# Patient Record
Sex: Female | Born: 1985
Health system: Southern US, Community
[De-identification: ages and names within clinical notes are randomized; demographics above are authoritative.]

## PROBLEM LIST (undated history)

## (undated) DIAGNOSIS — Z862 Personal history of diseases of the blood and blood-forming organs and certain disorders involving the immune mechanism: Secondary | ICD-10-CM

## (undated) DIAGNOSIS — G43909 Migraine, unspecified, not intractable, without status migrainosus: Secondary | ICD-10-CM

## (undated) DIAGNOSIS — Z973 Presence of spectacles and contact lenses: Secondary | ICD-10-CM

## (undated) DIAGNOSIS — M1990 Unspecified osteoarthritis, unspecified site: Secondary | ICD-10-CM

## (undated) DIAGNOSIS — D649 Anemia, unspecified: Secondary | ICD-10-CM

## (undated) DIAGNOSIS — N8003 Adenomyosis of the uterus: Secondary | ICD-10-CM

## (undated) DIAGNOSIS — F909 Attention-deficit hyperactivity disorder, unspecified type: Secondary | ICD-10-CM

## (undated) DIAGNOSIS — M069 Rheumatoid arthritis, unspecified: Secondary | ICD-10-CM

## (undated) HISTORY — PX: OVUM / OOCYTE RETRIEVAL: SUR1269

## (undated) HISTORY — PX: HYSTEROSCOPY: SHX211

## (undated) HISTORY — DX: Attention-deficit hyperactivity disorder, unspecified type: F90.9

## (undated) HISTORY — DX: Migraine, unspecified, not intractable, without status migrainosus: G43.909

## (undated) HISTORY — PX: NO PAST SURGERIES: SHX2092

---

## 2016-11-29 HISTORY — PX: DILATION AND CURETTAGE OF UTERUS: SHX78

## 2017-02-16 HISTORY — PX: DILATION AND CURETTAGE OF UTERUS: SHX78

## 2019-06-27 LAB — OB RESULTS CONSOLE RUBELLA ANTIBODY, IGM: Rubella: IMMUNE

## 2019-06-27 LAB — OB RESULTS CONSOLE HEPATITIS B SURFACE ANTIGEN: Hepatitis B Surface Ag: NEGATIVE

## 2019-09-19 ENCOUNTER — Other Ambulatory Visit: Payer: Self-pay

## 2019-09-19 DIAGNOSIS — Z20822 Contact with and (suspected) exposure to covid-19: Secondary | ICD-10-CM

## 2019-09-20 LAB — NOVEL CORONAVIRUS, NAA: SARS-CoV-2, NAA: NOT DETECTED

## 2019-11-28 DIAGNOSIS — Z3A32 32 weeks gestation of pregnancy: Secondary | ICD-10-CM | POA: Diagnosis not present

## 2019-11-28 DIAGNOSIS — O3663X0 Maternal care for excessive fetal growth, third trimester, not applicable or unspecified: Secondary | ICD-10-CM | POA: Diagnosis not present

## 2019-12-13 DIAGNOSIS — Z23 Encounter for immunization: Secondary | ICD-10-CM | POA: Diagnosis not present

## 2019-12-26 DIAGNOSIS — Z3A36 36 weeks gestation of pregnancy: Secondary | ICD-10-CM | POA: Diagnosis not present

## 2019-12-26 DIAGNOSIS — O368399 Maternal care for abnormalities of the fetal heart rate or rhythm, unspecified trimester, other fetus: Secondary | ICD-10-CM | POA: Diagnosis not present

## 2019-12-26 DIAGNOSIS — Z3685 Encounter for antenatal screening for Streptococcus B: Secondary | ICD-10-CM | POA: Diagnosis not present

## 2019-12-28 ENCOUNTER — Inpatient Hospital Stay (HOSPITAL_COMMUNITY)
Admission: AD | Admit: 2019-12-28 | Discharge: 2019-12-29 | Disposition: A | Discharge status: Home / Self Care | Payer: BC Managed Care – PPO | Attending: Obstetrics and Gynecology | Admitting: Obstetrics and Gynecology

## 2019-12-28 ENCOUNTER — Other Ambulatory Visit: Payer: Self-pay

## 2019-12-28 ENCOUNTER — Encounter (HOSPITAL_COMMUNITY): Payer: Self-pay | Admitting: Obstetrics and Gynecology

## 2019-12-28 DIAGNOSIS — O26893 Other specified pregnancy related conditions, third trimester: Secondary | ICD-10-CM | POA: Diagnosis not present

## 2019-12-28 DIAGNOSIS — W19XXXA Unspecified fall, initial encounter: Secondary | ICD-10-CM

## 2019-12-28 DIAGNOSIS — Z79899 Other long term (current) drug therapy: Secondary | ICD-10-CM | POA: Diagnosis not present

## 2019-12-28 DIAGNOSIS — O9A213 Injury, poisoning and certain other consequences of external causes complicating pregnancy, third trimester: Secondary | ICD-10-CM | POA: Diagnosis present

## 2019-12-28 DIAGNOSIS — W1839XA Other fall on same level, initial encounter: Secondary | ICD-10-CM | POA: Insufficient documentation

## 2019-12-28 DIAGNOSIS — Z7982 Long term (current) use of aspirin: Secondary | ICD-10-CM | POA: Insufficient documentation

## 2019-12-28 DIAGNOSIS — Z20822 Contact with and (suspected) exposure to covid-19: Secondary | ICD-10-CM | POA: Diagnosis not present

## 2019-12-28 DIAGNOSIS — S80211A Abrasion, right knee, initial encounter: Secondary | ICD-10-CM | POA: Insufficient documentation

## 2019-12-28 DIAGNOSIS — Y92481 Parking lot as the place of occurrence of the external cause: Secondary | ICD-10-CM | POA: Insufficient documentation

## 2019-12-28 DIAGNOSIS — O4700 False labor before 37 completed weeks of gestation, unspecified trimester: Secondary | ICD-10-CM | POA: Diagnosis not present

## 2019-12-28 DIAGNOSIS — Z3A36 36 weeks gestation of pregnancy: Secondary | ICD-10-CM | POA: Diagnosis not present

## 2019-12-28 DIAGNOSIS — S3991XA Unspecified injury of abdomen, initial encounter: Secondary | ICD-10-CM | POA: Insufficient documentation

## 2019-12-28 DIAGNOSIS — O4703 False labor before 37 completed weeks of gestation, third trimester: Secondary | ICD-10-CM | POA: Diagnosis not present

## 2019-12-28 HISTORY — DX: Unspecified osteoarthritis, unspecified site: M19.90

## 2019-12-28 LAB — TYPE AND SCREEN
ABO/RH(D): A POS
Antibody Screen: NEGATIVE

## 2019-12-28 LAB — ABO/RH: ABO/RH(D): A POS

## 2019-12-28 LAB — OB RESULTS CONSOLE GBS: GBS: NEGATIVE

## 2019-12-28 LAB — SARS CORONAVIRUS 2 (TAT 6-24 HRS): SARS Coronavirus 2: NEGATIVE

## 2019-12-28 MED ORDER — DOCUSATE SODIUM 100 MG PO CAPS
100.0000 mg | ORAL_CAPSULE | Freq: Every day | ORAL | Status: DC
  Administered 2019-12-29: 09:00:00 100 mg via ORAL
  Filled 2019-12-28: qty 1

## 2019-12-28 MED ORDER — SODIUM CHLORIDE 0.9 % IV SOLN: 250.0000 mL | INTRAVENOUS | Status: DC | PRN

## 2019-12-28 MED ORDER — SODIUM CHLORIDE 0.9% FLUSH
3.0000 mL | Freq: Two times a day (BID) | INTRAVENOUS | Status: DC
  Administered 2019-12-28 – 2019-12-29 (×2): 3 mL via INTRAVENOUS

## 2019-12-28 MED ORDER — ACETAMINOPHEN 325 MG PO TABS: 650.0000 mg | ORAL_TABLET | ORAL | Status: DC | PRN

## 2019-12-28 MED ORDER — ZOLPIDEM TARTRATE 5 MG PO TABS: 5.0000 mg | ORAL_TABLET | Freq: Every evening | ORAL | Status: DC | PRN

## 2019-12-28 MED ORDER — CALCIUM CARBONATE ANTACID 500 MG PO CHEW: 2.0000 | CHEWABLE_TABLET | ORAL | Status: DC | PRN

## 2019-12-28 MED ORDER — SODIUM CHLORIDE 0.9% FLUSH: 3.0000 mL | INTRAVENOUS | Status: DC | PRN

## 2019-12-28 MED ORDER — PRENATAL MULTIVITAMIN CH
1.0000 | ORAL_TABLET | Freq: Every day | ORAL | Status: DC
  Administered 2019-12-29: 09:00:00 1 via ORAL
  Filled 2019-12-28: qty 1

## 2019-12-28 NOTE — H&P (Signed)
Colleen [name] is a 34 y.o. female G3P1011 at 36+ with fall on abdomen and contractions - admitted for observation for 24 hours.  Blood type is A+, Placenta is posterior/fundal.    OB History    Gravida  2   Para  1   Term  1   Preterm      AB      Living  1     SAB      TAB      Ectopic      Multiple      Live Births  1         G1 SAB G2 02/01/17 - SVD female (Jonah) 7#5 - PIH 39wk G3 present  No abn pap, last 4/18 No STD  Past Medical History:  Diagnosis Date  . Arthritis   Rheumatoid arthritis  History reviewed. No pertinent surgical history. Family History: HTN, CAD, Alzheimer's  Social History:  reports that she has never smoked. She has never used smokeless tobacco. She reports that she does not drink alcohol or use drugs. married music and Designer, industrial/product at [employer] cyclobenzaprine, Iron, hydroxychloroquine, ASA All NKDA     Maternal Diabetes: No Genetic Screening: Abnormal:  Results: Elevated risk of Trisomy 21 Maternal Ultrasounds/Referrals: Declined Fetal Ultrasounds or other Referrals:  None Maternal Substance Abuse:  No Significant Maternal Medications:  Meds include: Other: hydrochloroquine Significant Maternal Lab Results:  Group B Strep negative Other Comments:  declined panorama or MFM referral DSR 1:100  Review of Systems  Constitutional: Negative.   HENT: Negative.   Eyes: Negative.   Respiratory: Negative.   Cardiovascular: Negative.   Gastrointestinal: Negative.        GRAVID  Genitourinary: Negative.   Musculoskeletal: Positive for myalgias.  Skin: Negative.   Psychiatric/Behavioral: Negative.    Maternal Medical History:  Contractions: Frequency: irregular.    Fetal activity: Perceived fetal activity is normal.    Prenatal complications: Fell on abdomen; h/o PIH IOL at 39wk  Prenatal Complications - Diabetes: none.      Blood pressure 125/73, pulse 84, temperature 98.1 F (36.7 C), temperature source  Oral, resp. rate 16, height 5\' 2"  (1.575 m), weight 87.1 kg, last menstrual period 04/18/2019, SpO2 100 %. Maternal Exam:  Uterine Assessment: Contraction strength is mild.  Abdomen: Patient reports no abdominal tenderness. Fundal height is appropriate for gestation.   Estimated fetal weight is 6.5#.    Introitus: Normal vulva. Normal vagina.    Physical Exam  Constitutional: She is oriented to person, place, and time. She appears well-developed and well-nourished.  HENT:  Head: Normocephalic and atraumatic.  Cardiovascular: Normal rate and regular rhythm.  Respiratory: Effort normal and breath sounds normal. No respiratory distress. She has no wheezes.  GI: Soft. Bowel sounds are normal.  GRAVID  Genitourinary:    Vulva normal.   Musculoskeletal:        General: Normal range of motion.  Neurological: She is alert and oriented to person, place, and time.  Skin: Skin is warm and dry.  Psychiatric: She has a normal mood and affect. Her behavior is normal.    Prenatal labs: ABO, Rh: --/--/A POS, A POS Performed at [facility], [address] [place], [place]  [phone]) Antibody: NEG (01/29 1608) Rubella: Immune (07/29 0000) RPR:   NR HBsAg: Negative (07/29 0000)  HIV:   NR GBS:   neg  Flu 9/28, Tdap 1/14 Hgb 12.7/Plt 296/Ur Cx neg/GC neg/Chl neg/Varicella immune/Hgb  electro WNL/CF neg/Tay Sach's neg/Sickle neg/SMA neg/ Fragile X neg/glucola 92/Hep C neg  First trimester screen 1:100 DSR, nl NT Declines MFM referral or NIPT  EDC 2/24 - nl early anat, post plac, nl NT Anat [place] - nl anat, post plac, surprise gender    Assessment/Plan: 33yo G3P1011 at 36+ w abdominal trauma  Admit for 24 hr obs - cont EFM and toco Regular diet Monitor ctx  Judah Chevere Bovard-Stuckert 12/28/2019, 5:54 PM

## 2019-12-28 NOTE — MAU Provider Note (Signed)
History     CSN: [id number]  Arrival date and time: 12/28/19 1352   First Provider Initiated Contact with Patient 12/28/19 1520      Chief Complaint  Patient presents with  . Fall   HPI   Ms.Colleen [name] is a 34 y.o. female G2P1001 @ [redacted]w[redacted]d here in MAU with complaints of fall. States she rolled her ankle in the parking lot and fell onto her abdomen. "my abdomen hit the pavement". She says her right knee and right hand landed first and then her abdomen hit the pavement. No bleeding, + fetal movement.   OB History    Gravida  2   Para  1   Term  1   Preterm      AB      Living  1     SAB      TAB      Ectopic      Multiple      Live Births  1           Past Medical History:  Diagnosis Date  . Arthritis     History reviewed. No pertinent surgical history.  History reviewed. No pertinent family history.  Social History   Tobacco Use  . Smoking status: Never Smoker  . Smokeless tobacco: Never Used  Substance Use Topics  . Alcohol use: Never  . Drug use: Never    Allergies: No Known Allergies  Medications Prior to Admission  Medication Sig Dispense Refill Last Dose  . aspirin 81 MG chewable tablet Chew by mouth daily.   12/27/2019 at Unknown time  . hydroxychloroquine (PLAQUENIL) 200 MG tablet Take 300 mg by mouth daily.   12/27/2019 at Unknown time  . Prenatal Vit-Fe Fumarate-FA (PRENATAL MULTIVITAMIN) TABS tablet Take 1 tablet by mouth daily at 12 noon.   12/27/2019 at Unknown time   Results for orders placed or performed during the hospital encounter of 12/28/19 (from the past 48 hour(s))  Type and screen [facility]     Status: None   Collection Time: 12/28/19  4:08 PM  Result Value Ref Range   ABO/RH(D) A POS    Antibody Screen NEG    Sample Expiration      12/31/2019,2359 Performed at [facility], [address] [place], [place] [address]   ABO/Rh     Status: None   Collection Time: 12/28/19  4:08 PM  Result  Value Ref Range   ABO/RH(D)      A POS Performed at [facility], [address] [place], [place] [address]    Review of Systems  Constitutional: Negative for fever.  Gastrointestinal: Negative for abdominal pain.  Genitourinary: Negative for vaginal bleeding and vaginal discharge.   Physical Exam   Blood pressure 134/74, pulse 96, temperature 98 F (36.7 C), resp. rate 16, height 5\' 2"  (1.575 m), weight 87.1 kg, last menstrual period 04/18/2019.  Physical Exam  Constitutional: She is oriented to person, place, and time. She appears well-developed and well-nourished. No distress.  HENT:  Head: Normocephalic.  Respiratory: Effort normal.  GI: Soft. She exhibits no distension. There is no abdominal tenderness. There is no rebound and no guarding.  Musculoskeletal:        General: Normal range of motion.  Neurological: She is alert and oriented to person, place, and time.  Skin: Skin is warm. She is not diaphoretic.  Psychiatric: Her behavior is normal.   Fetal Tracing: Baseline: 140 bpm Variability: Moderate  Accelerations: 15x15 Decelerations: None Toco: irregular pattern   MAU Course  Procedures  None  MDM   Discussed patient with Dr. Roselie Awkward  Discussed patient arrival with Dr. Melba Coon. Patient had a direct fall to the abdomen with frequent uterine contractions. No pain or bleeding. Admit to [facility] speciality per Dr. Melba Coon.   Assessment and Plan   A:  1. Fall, initial encounter   2. [redacted] weeks gestation of pregnancy   3. Preterm uterine contractions, antepartum     P:  Admit to OB speciality Continuous fetal monitoring Dr. Melba Coon to resume care.  Admit orders placed  Jenney Brester, Artist Pais, NP 12/28/2019 8:53 PM

## 2019-12-28 NOTE — MAU Note (Signed)
.   Colleen [name] is a 34 y.o. at [redacted]w[redacted]d here in MAU reporting: she fell today and landed on her abdomen. Abrasion to right knee. Denies pain at present  Onset of complaint: today around 1300 Pain score: 0 Vitals:   12/28/19 1447  BP: (!) 141/78  Pulse: (!) 102  Resp: 16  Temp: 98 F (36.7 C)   FHT:155 Lab orders placed from triage:

## 2019-12-29 MED ORDER — PRENATAL MULTIVITAMIN CH: 1.0000 | ORAL_TABLET | Freq: Every day | ORAL | 3 refills | Status: DC

## 2019-12-29 NOTE — Plan of Care (Signed)
  Problem: Education: Goal: Knowledge of General Education information will improve Description Including pain rating scale, medication(s)/side effects and non-pharmacologic comfort measures Outcome: Progressing   Problem: Clinical Measurements: Goal: Ability to maintain clinical measurements within normal limits will improve Outcome: Progressing Goal: Will remain free from infection Outcome: Progressing   Problem: Nutrition: Goal: Adequate nutrition will be maintained Outcome: Progressing   Problem: Safety: Goal: Ability to remain free from injury will improve Outcome: Progressing   

## 2019-12-29 NOTE — Progress Notes (Signed)
Dr. Ellyn Hack notified prior to patient discharge. OK with MD for patient to go home.

## 2019-12-29 NOTE — Progress Notes (Signed)
Pt given discharge instructions. All questions answered. IV discontinued. Dr. Ellyn Hack to be called at 1300 prior to discharge as requested.

## 2019-12-29 NOTE — Progress Notes (Signed)
Pt ambulated at discharge accompanied by spouse and NT. Pt sent with all belongings. Pt in stable condition without issues. Discharge paperwork sent with patient. All questions answered.

## 2019-12-29 NOTE — Discharge Summary (Signed)
    OB Discharge Summary     Patient Name: Colleen [name] DOB: 09/05/1986 MRN: [id number]  Date of admission: 12/28/2019 Delivering MD: This patient has no babies on file.  Date of discharge: 12/29/2019  Admitting diagnosis: 36WKS FALL Intrauterine pregnancy: [redacted]w[redacted]d     Secondary diagnosis:  Active Problems:   Traumatic injury during pregnancy in third trimester  Additional problems: N/A     Discharge diagnosis: observation , [redacted]wk pregnant                                                                                              Post partum procedures:N/A  Augmentation: N/A  Complications: None  Hospital course:  Admit for observation after fall and trauma to abdomen  Physical exam  Vitals:   12/29/19 0724 12/29/19 0739 12/29/19 0740 12/29/19 1147  BP:   121/78 123/69  Pulse:   86 86  Resp:   18   Temp:   99 F (37.2 C)   TempSrc:   Oral   SpO2: 98% 100% 100%   Weight:      Height:       General: alert and no distress  FHTs 140-150, mod var, + accels, category 1 toco irr, some irritability  Labs:No results found for: WBC, HGB, HCT, MCV, PLT No flowsheet data found.  Discharge instruction: Call office w questions or problems  After visit meds:  Allergies as of 12/29/2019   No Known Allergies     Medication List    TAKE these medications   aspirin 81 MG chewable tablet Chew by mouth daily.   hydroxychloroquine 200 MG tablet Commonly known as: PLAQUENIL Take 300 mg by mouth daily.   prenatal multivitamin Tabs tablet Take 1 tablet by mouth daily at 12 noon.       Diet: routine diet  Activity: Advance as tolerated. Pelvic rest for 6 weeks.   Outpatient follow up:as scheduled, Wednesday Follow up Appt:No future appointments. Follow up Visit:No follow-ups on file.   12/29/2019 Sherian Rein, MD

## 2019-12-29 NOTE — Progress Notes (Signed)
Patient ID: Colleen [name], female   DOB: [birth year], 34 y.o.   MRN: [id number]  36+3 w abdominal trauma yesterday (fell on abdomen, first hit knees and hands)  No c/o's.  +FM, no LOF, no VB, occ ctx - have slowed, some.    AF VSS gen NAD FHTs 120-150's, mod var, +accels, category 1 toco irr, some irritability  D/C 24 hr after accident. Given abruption precautions F/u as scheduled

## 2020-01-02 DIAGNOSIS — Z3A36 36 weeks gestation of pregnancy: Secondary | ICD-10-CM | POA: Diagnosis not present

## 2020-01-02 DIAGNOSIS — Z3A37 37 weeks gestation of pregnancy: Secondary | ICD-10-CM | POA: Diagnosis not present

## 2020-01-02 DIAGNOSIS — Z8759 Personal history of other complications of pregnancy, childbirth and the puerperium: Secondary | ICD-10-CM | POA: Diagnosis not present

## 2020-01-02 DIAGNOSIS — R030 Elevated blood-pressure reading, without diagnosis of hypertension: Secondary | ICD-10-CM | POA: Diagnosis not present

## 2020-01-09 ENCOUNTER — Inpatient Hospital Stay (HOSPITAL_COMMUNITY)
Admission: AD | Admit: 2020-01-09 | Discharge: 2020-01-12 | DRG: 788 | Disposition: A | Discharge status: Home / Self Care | Payer: BC Managed Care – PPO | Attending: Obstetrics and Gynecology | Admitting: Obstetrics and Gynecology

## 2020-01-09 ENCOUNTER — Other Ambulatory Visit: Payer: Self-pay

## 2020-01-09 ENCOUNTER — Inpatient Hospital Stay (HOSPITAL_COMMUNITY): Payer: BC Managed Care – PPO | Admitting: Anesthesiology

## 2020-01-09 ENCOUNTER — Encounter (HOSPITAL_COMMUNITY): Payer: Self-pay | Admitting: Obstetrics and Gynecology

## 2020-01-09 DIAGNOSIS — O134 Gestational [pregnancy-induced] hypertension without significant proteinuria, complicating childbirth: Principal | ICD-10-CM | POA: Diagnosis present

## 2020-01-09 DIAGNOSIS — Z20822 Contact with and (suspected) exposure to covid-19: Secondary | ICD-10-CM | POA: Diagnosis present

## 2020-01-09 DIAGNOSIS — Z98891 History of uterine scar from previous surgery: Secondary | ICD-10-CM

## 2020-01-09 DIAGNOSIS — Z3490 Encounter for supervision of normal pregnancy, unspecified, unspecified trimester: Secondary | ICD-10-CM

## 2020-01-09 DIAGNOSIS — O164 Unspecified maternal hypertension, complicating childbirth: Secondary | ICD-10-CM | POA: Diagnosis not present

## 2020-01-09 DIAGNOSIS — Z3A38 38 weeks gestation of pregnancy: Secondary | ICD-10-CM

## 2020-01-09 DIAGNOSIS — O9A213 Injury, poisoning and certain other consequences of external causes complicating pregnancy, third trimester: Secondary | ICD-10-CM

## 2020-01-09 DIAGNOSIS — Z23 Encounter for immunization: Secondary | ICD-10-CM | POA: Diagnosis not present

## 2020-01-09 LAB — CBC
HCT: 35 % — ABNORMAL LOW (ref 36.0–46.0)
Hemoglobin: 11.8 g/dL — ABNORMAL LOW (ref 12.0–15.0)
MCH: 32.1 pg (ref 26.0–34.0)
MCHC: 33.7 g/dL (ref 30.0–36.0)
MCV: 95.1 fL (ref 80.0–100.0)
Platelets: 278 10*3/uL (ref 150–400)
RBC: 3.68 MIL/uL — ABNORMAL LOW (ref 3.87–5.11)
RDW: 12.8 % (ref 11.5–15.5)
WBC: 13.9 10*3/uL — ABNORMAL HIGH (ref 4.0–10.5)
nRBC: 0 % (ref 0.0–0.2)

## 2020-01-09 LAB — COMPREHENSIVE METABOLIC PANEL
ALT: 14 U/L (ref 0–44)
AST: 17 U/L (ref 15–41)
Albumin: 2.6 g/dL — ABNORMAL LOW (ref 3.5–5.0)
Alkaline Phosphatase: 135 U/L — ABNORMAL HIGH (ref 38–126)
Anion gap: 10 (ref 5–15)
BUN: 8 mg/dL (ref 6–20)
CO2: 18 mmol/L — ABNORMAL LOW (ref 22–32)
Calcium: 8.7 mg/dL — ABNORMAL LOW (ref 8.9–10.3)
Chloride: 103 mmol/L (ref 98–111)
Creatinine, Ser: 0.68 mg/dL (ref 0.44–1.00)
GFR calc Af Amer: >60 mL/min (ref >60)
GFR calc non Af Amer: >60 mL/min (ref >60)
Glucose, Bld: 88 mg/dL (ref 70–99)
Potassium: 3.7 mmol/L (ref 3.5–5.1)
Sodium: 131 mmol/L — ABNORMAL LOW (ref 135–145)
Total Bilirubin: 0.4 mg/dL (ref 0.3–1.2)
Total Protein: 5.8 g/dL — ABNORMAL LOW (ref 6.5–8.1)

## 2020-01-09 LAB — TYPE AND SCREEN
ABO/RH(D): A POS
Antibody Screen: NEGATIVE

## 2020-01-09 LAB — SARS CORONAVIRUS 2 (TAT 6-24 HRS): SARS Coronavirus 2: NEGATIVE

## 2020-01-09 MED ORDER — OXYTOCIN BOLUS FROM INFUSION: 500.0000 mL | Freq: Once | INTRAVENOUS | Status: DC

## 2020-01-09 MED ORDER — PHENYLEPHRINE 40 MCG/ML (10ML) SYRINGE FOR IV PUSH (FOR BLOOD PRESSURE SUPPORT)
80.0000 ug | PREFILLED_SYRINGE | INTRAVENOUS | Status: DC | PRN
  Administered 2020-01-09 (×2): 80 ug via INTRAVENOUS

## 2020-01-09 MED ORDER — PHENYLEPHRINE 40 MCG/ML (10ML) SYRINGE FOR IV PUSH (FOR BLOOD PRESSURE SUPPORT)
80.0000 ug | PREFILLED_SYRINGE | INTRAVENOUS | Status: DC | PRN
  Filled 2020-01-09: qty 10

## 2020-01-09 MED ORDER — EPHEDRINE 5 MG/ML INJ: 10.0000 mg | INTRAVENOUS | Status: DC | PRN

## 2020-01-09 MED ORDER — SOD CITRATE-CITRIC ACID 500-334 MG/5ML PO SOLN
30.0000 mL | ORAL | Status: DC | PRN
  Administered 2020-01-09 – 2020-01-10 (×2): 30 mL via ORAL
  Filled 2020-01-09 (×2): qty 30

## 2020-01-09 MED ORDER — LACTATED RINGERS IV SOLN: INTRAVENOUS | Status: DC

## 2020-01-09 MED ORDER — FLEET ENEMA 7-19 GM/118ML RE ENEM: 1.0000 | ENEMA | RECTAL | Status: DC | PRN

## 2020-01-09 MED ORDER — DIPHENHYDRAMINE HCL 50 MG/ML IJ SOLN
12.5000 mg | INTRAMUSCULAR | Status: DC | PRN
  Administered 2020-01-10: 12.5 mg via INTRAVENOUS
  Filled 2020-01-09: qty 1

## 2020-01-09 MED ORDER — LIDOCAINE HCL (PF) 1 % IJ SOLN: 30.0000 mL | INTRAMUSCULAR | Status: DC | PRN

## 2020-01-09 MED ORDER — LACTATED RINGERS IV SOLN
500.0000 mL | Freq: Once | INTRAVENOUS | Status: AC
  Administered 2020-01-09: 500 mL via INTRAVENOUS

## 2020-01-09 MED ORDER — LIDOCAINE HCL (PF) 1 % IJ SOLN
INTRAMUSCULAR | Status: DC | PRN
  Administered 2020-01-09: 5 mL via EPIDURAL

## 2020-01-09 MED ORDER — LACTATED RINGERS IV SOLN
500.0000 mL | INTRAVENOUS | Status: DC | PRN
  Administered 2020-01-09: 500 mL via INTRAVENOUS

## 2020-01-09 MED ORDER — OXYCODONE-ACETAMINOPHEN 5-325 MG PO TABS: 1.0000 | ORAL_TABLET | ORAL | Status: DC | PRN

## 2020-01-09 MED ORDER — ONDANSETRON HCL 4 MG/2ML IJ SOLN: 4.0000 mg | Freq: Four times a day (QID) | INTRAMUSCULAR | Status: DC | PRN

## 2020-01-09 MED ORDER — OXYCODONE-ACETAMINOPHEN 5-325 MG PO TABS: 2.0000 | ORAL_TABLET | ORAL | Status: DC | PRN

## 2020-01-09 MED ORDER — ACETAMINOPHEN 325 MG PO TABS: 650.0000 mg | ORAL_TABLET | ORAL | Status: DC | PRN

## 2020-01-09 MED ORDER — SODIUM CHLORIDE (PF) 0.9 % IJ SOLN
INTRAMUSCULAR | Status: DC | PRN
  Administered 2020-01-09: 12 mL/h via EPIDURAL

## 2020-01-09 MED ORDER — OXYTOCIN 40 UNITS IN NORMAL SALINE INFUSION - SIMPLE MED: 2.5000 [IU]/h | INTRAVENOUS | Status: DC

## 2020-01-09 MED ORDER — FENTANYL-BUPIVACAINE-NACL 0.5-0.125-0.9 MG/250ML-% EP SOLN
12.0000 mL/h | EPIDURAL | Status: DC | PRN
  Filled 2020-01-09: qty 250

## 2020-01-09 NOTE — Anesthesia Procedure Notes (Signed)
Epidural Patient location during procedure: OB Start time: 01/09/2020 8:30 PM End time: 01/09/2020 8:46 PM  Staffing Anesthesiologist: Trevor Iha, MD Performed: anesthesiologist   Preanesthetic Checklist Completed: patient identified, IV checked, site marked, risks and benefits discussed, surgical consent, monitors and equipment checked, pre-op evaluation and timeout performed  Epidural Patient position: sitting Prep: DuraPrep and site prepped and draped Patient monitoring: continuous pulse ox and blood pressure Approach: midline Location: L3-L4 Injection technique: LOR air  Needle:  Needle type: Tuohy  Needle gauge: 17 G Needle length: 9 cm and 9 Needle insertion depth: 7 cm Catheter type: closed end flexible Catheter size: 19 Gauge Catheter at skin depth: 12 cm Test dose: negative  Assessment Events: blood not aspirated, injection not painful, no injection resistance, no paresthesia and negative IV test  Additional Notes Patient identified. Risks/Benefits/Options discussed with patient including but not limited to bleeding, infection, nerve damage, paralysis, failed block, incomplete pain control, headache, blood pressure changes, nausea, vomiting, reactions to medication both or allergic, itching and postpartum back pain. Confirmed with bedside nurse the patient's most recent platelet count. Confirmed with patient that they are not currently taking any anticoagulation, have any bleeding history or any family history of bleeding disorders. Patient expressed understanding and wished to proceed. All questions were answered. Sterile technique was used throughout the entire procedure. Please see nursing notes for vital signs. Test dose was given through epidural needle and negative prior to continuing to dose epidural or start infusion. Warning signs of high block given to the patient including shortness of breath, tingling/numbness in hands, complete motor block, or any  concerning symptoms with instructions to call for help. Patient was given instructions on fall risk and not to get out of bed. All questions and concerns addressed with instructions to call with any issues.  1 Attempt (S) . Patient tolerated procedure well.

## 2020-01-09 NOTE — Anesthesia Preprocedure Evaluation (Signed)
Anesthesia Evaluation  Patient identified by MRN, date of birth, ID band Patient awake    Reviewed: Allergy & Precautions, NPO status , Patient's Chart, lab work & pertinent test results  Airway Mallampati: II  TM Distance: >3 FB Neck ROM: Full    Dental no notable dental hx. (+) Teeth Intact, Dental Advisory Given   Pulmonary  Pend Coronavirus test sent at 1820   Pulmonary exam normal breath sounds clear to auscultation       Cardiovascular hypertension, Pt. on medications Normal cardiovascular exam Rhythm:Regular Rate:Normal     Neuro/Psych negative neurological ROS     GI/Hepatic negative GI ROS, Neg liver ROS,   Endo/Other  negative endocrine ROS  Renal/GU negative Renal ROS     Musculoskeletal   Abdominal (+) + obese,   Peds  Hematology Hgb 11.8 Plt 273   Anesthesia Other Findings   Reproductive/Obstetrics (+) Pregnancy                             Anesthesia Physical Anesthesia Plan  ASA: III  Anesthesia Plan: Epidural   Post-op Pain Management:    Induction:   PONV Risk Score and Plan:   Airway Management Planned:   Additional Equipment:   Intra-op Plan:   Post-operative Plan:   Informed Consent: I have reviewed the patients History and Physical, chart, labs and discussed the procedure including the risks, benefits and alternatives for the proposed anesthesia with the patient or authorized representative who has indicated his/her understanding and acceptance.       Plan Discussed with:   Anesthesia Plan Comments: (38 wk G2P1 w Gest HTN for LEA)        Anesthesia Quick Evaluation

## 2020-01-09 NOTE — H&P (Signed)
Colleen [name] is a 34 y.o. female G3P1011 at 64+ with PIH for IOL.  Has h/o PreE/PIH and LONG induction w G1.  Also IVF pregnancy.  Pt has RA - fairly well controlled.  PNC complicated by increased risk of DS 1:100 on first trimester screen.  Declined MFM or NIPT.  Received Tdap and Flu vaccine in [facility].    OB History    Gravida  2   Para  1   Term  1   Preterm      AB      Living  1     SAB      TAB      Ectopic      Multiple      Live Births  1         G1 SAB G2 02/01/17 SVD, female, 7#6, D&C PP for retained placenta G3 present  No abn pap (last 4/19) No STD  Past Medical History:  Diagnosis Date  . Arthritis    PSH: D&C retained placenta, tooth extraction, hysteroscopy for uterine septum; egg retrieval Family History: HTN, CAD, Alzheimer's  Social History:  reports that she has never smoked. She has never used smokeless tobacco. 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   HENT: Negative.   Eyes: Negative.   Respiratory: Negative.   Cardiovascular: Negative.   Gastrointestinal: Negative.   Endocrine: Negative.   Genitourinary: Negative.   Musculoskeletal: Negative.   Skin: Negative.   Neurological: Positive for headaches (occ).  Psychiatric/Behavioral: Negative.    Maternal Medical History:  Contractions: Frequency: irregular.    Fetal activity: Perceived fetal activity is normal.    Prenatal complications: PIH, h/o  PreE  Prenatal Complications - Diabetes: none.      Blood pressure 122/81, pulse 78, temperature 98.3 F (36.8 C), temperature source Oral, resp. rate 18, height 5\' 2"  (1.575 m), weight 90.2 kg, last menstrual period 04/18/2019, SpO2 99 %. Maternal Exam:  Uterine Assessment: Contraction frequency is irregular.   Abdomen: Patient reports no abdominal tenderness. Fundal height is app for gestation.   Estimated fetal weight is 7-8#.   Fetal presentation: vertex  Introitus: Normal vulva. Normal vagina.    Physical Exam  Constitutional: She is oriented to person, place, and time. She appears well-developed and well-nourished.  HENT:  Head: Normocephalic and atraumatic.  Cardiovascular: Normal rate and regular rhythm.  Respiratory: Effort normal and breath sounds normal. No respiratory distress. She has no wheezes.  GI: Soft. Bowel sounds are normal. She exhibits no distension. There is no abdominal tenderness.  Genitourinary:    Vulva normal.   Musculoskeletal:        General: Normal range of motion.  Neurological: She is alert and oriented to person, place, and time.  Skin: Skin is warm and dry.    Prenatal labs: ABO, Rh: --/--/A POS (02/10 1910) Antibody: NEG (02/10 1910) Rubella: Immune (07/29 0000) RPR:   NR HBsAg: Negative (07/29 0000)  HIV:   NR GBS:   neg  Flu 9/28; Tdap 1/14 SVE  3.4/30/-3  Hgb 12.7/Plt 296/Ur Cx neg/Chl neg/GC neg/Varicella immune/Hgb electro WNL/CF neg/ Craige Cotta neg/Sickle Cell neg/SMA neg/Fragile X neg/glucola 92/Hep C neg/ [place] nl anat, post plac,  Nl PIH labs  Assessment/Plan: 34YO G3P1011 at 38+ with PIH for IOL D/w pt r/b/a of IOL and process Epidural prn AROM and pitocin for IOL  Colleen [name] 01/09/2020, 9:23 PM

## 2020-01-10 ENCOUNTER — Encounter (HOSPITAL_COMMUNITY)
Admission: AD | Disposition: A | Discharge status: Home / Self Care | Payer: Self-pay | Source: Home / Self Care | Attending: Obstetrics and Gynecology

## 2020-01-10 ENCOUNTER — Encounter (HOSPITAL_COMMUNITY): Payer: Self-pay | Admitting: Obstetrics and Gynecology

## 2020-01-10 DIAGNOSIS — Z98891 History of uterine scar from previous surgery: Secondary | ICD-10-CM

## 2020-01-10 LAB — RPR: RPR Ser Ql: NONREACTIVE

## 2020-01-10 SURGERY — Surgical Case
Anesthesia: Epidural | Site: Abdomen | Wound class: Clean Contaminated

## 2020-01-10 MED ORDER — LIDOCAINE 2% (20 MG/ML) 5 ML SYRINGE
INTRAMUSCULAR | Status: AC
  Filled 2020-01-10: qty 5

## 2020-01-10 MED ORDER — LIDOCAINE-EPINEPHRINE (PF) 2 %-1:200000 IJ SOLN
INTRAMUSCULAR | Status: DC | PRN
  Administered 2020-01-10: 10 mL via EPIDURAL

## 2020-01-10 MED ORDER — SIMETHICONE 80 MG PO CHEW
80.0000 mg | CHEWABLE_TABLET | Freq: Three times a day (TID) | ORAL | Status: DC
  Administered 2020-01-10 – 2020-01-12 (×6): 80 mg via ORAL
  Filled 2020-01-10 (×6): qty 1

## 2020-01-10 MED ORDER — KETOROLAC TROMETHAMINE 30 MG/ML IJ SOLN
INTRAMUSCULAR | Status: AC
  Filled 2020-01-10: qty 1

## 2020-01-10 MED ORDER — MORPHINE SULFATE (PF) 0.5 MG/ML IJ SOLN
INTRAMUSCULAR | Status: AC
  Filled 2020-01-10: qty 10

## 2020-01-10 MED ORDER — SCOPOLAMINE 1 MG/3DAYS TD PT72
1.0000 | MEDICATED_PATCH | Freq: Once | TRANSDERMAL | Status: DC
  Administered 2020-01-10: 1.5 mg via TRANSDERMAL

## 2020-01-10 MED ORDER — NALOXONE HCL 4 MG/10ML IJ SOLN
1.0000 ug/kg/h | INTRAVENOUS | Status: DC | PRN
  Filled 2020-01-10: qty 5

## 2020-01-10 MED ORDER — HYDROXYCHLOROQUINE SULFATE 200 MG PO TABS
300.0000 mg | ORAL_TABLET | Freq: Every day | ORAL | Status: DC
  Filled 2020-01-10 (×2): qty 1.5

## 2020-01-10 MED ORDER — DIPHENHYDRAMINE HCL 50 MG/ML IJ SOLN: 12.5000 mg | INTRAMUSCULAR | Status: DC | PRN

## 2020-01-10 MED ORDER — ACETAMINOPHEN 325 MG PO TABS
650.0000 mg | ORAL_TABLET | ORAL | Status: DC | PRN
  Administered 2020-01-11 (×2): 650 mg via ORAL
  Filled 2020-01-10 (×2): qty 2

## 2020-01-10 MED ORDER — MEPERIDINE HCL 25 MG/ML IJ SOLN: 6.2500 mg | INTRAMUSCULAR | Status: DC | PRN

## 2020-01-10 MED ORDER — SCOPOLAMINE 1 MG/3DAYS TD PT72
MEDICATED_PATCH | TRANSDERMAL | Status: AC
  Filled 2020-01-10: qty 1

## 2020-01-10 MED ORDER — KETOROLAC TROMETHAMINE 30 MG/ML IJ SOLN: 30.0000 mg | Freq: Four times a day (QID) | INTRAMUSCULAR | Status: AC | PRN

## 2020-01-10 MED ORDER — NALBUPHINE HCL 10 MG/ML IJ SOLN: 5.0000 mg | INTRAMUSCULAR | Status: DC | PRN

## 2020-01-10 MED ORDER — TERBUTALINE SULFATE 1 MG/ML IJ SOLN: 0.2500 mg | Freq: Once | INTRAMUSCULAR | Status: DC | PRN

## 2020-01-10 MED ORDER — SIMETHICONE 80 MG PO CHEW
80.0000 mg | CHEWABLE_TABLET | ORAL | Status: DC
  Administered 2020-01-10: 80 mg via ORAL
  Filled 2020-01-10 (×2): qty 1

## 2020-01-10 MED ORDER — SODIUM CHLORIDE 0.9% FLUSH: 3.0000 mL | INTRAVENOUS | Status: DC | PRN

## 2020-01-10 MED ORDER — MORPHINE SULFATE (PF) 0.5 MG/ML IJ SOLN
INTRAMUSCULAR | Status: DC | PRN
  Administered 2020-01-10: 3 mg via EPIDURAL

## 2020-01-10 MED ORDER — IBUPROFEN 800 MG PO TABS
800.0000 mg | ORAL_TABLET | Freq: Three times a day (TID) | ORAL | Status: DC
  Administered 2020-01-10 – 2020-01-12 (×6): 800 mg via ORAL
  Filled 2020-01-10 (×6): qty 1

## 2020-01-10 MED ORDER — DIBUCAINE (PERIANAL) 1 % EX OINT: 1.0000 "application " | TOPICAL_OINTMENT | CUTANEOUS | Status: DC | PRN

## 2020-01-10 MED ORDER — CEFAZOLIN SODIUM-DEXTROSE 2-4 GM/100ML-% IV SOLN
2.0000 g | Freq: Three times a day (TID) | INTRAVENOUS | Status: DC
  Administered 2020-01-10: 11:00:00 2 g via INTRAVENOUS
  Filled 2020-01-10 (×2): qty 100

## 2020-01-10 MED ORDER — OXYTOCIN 40 UNITS IN NORMAL SALINE INFUSION - SIMPLE MED
2.5000 [IU]/h | INTRAVENOUS | Status: AC
  Administered 2020-01-10: 2.5 [IU]/h via INTRAVENOUS

## 2020-01-10 MED ORDER — COCONUT OIL OIL: 1.0000 "application " | TOPICAL_OIL | Status: DC | PRN

## 2020-01-10 MED ORDER — MENTHOL 3 MG MT LOZG: 1.0000 | LOZENGE | OROMUCOSAL | Status: DC | PRN

## 2020-01-10 MED ORDER — ONDANSETRON HCL 4 MG/2ML IJ SOLN: 4.0000 mg | Freq: Three times a day (TID) | INTRAMUSCULAR | Status: DC | PRN

## 2020-01-10 MED ORDER — ONDANSETRON HCL 4 MG/2ML IJ SOLN
INTRAMUSCULAR | Status: AC
  Filled 2020-01-10: qty 2

## 2020-01-10 MED ORDER — SENNOSIDES-DOCUSATE SODIUM 8.6-50 MG PO TABS
2.0000 | ORAL_TABLET | ORAL | Status: DC
  Administered 2020-01-10 – 2020-01-12 (×2): 2 via ORAL
  Filled 2020-01-10 (×2): qty 2

## 2020-01-10 MED ORDER — WITCH HAZEL-GLYCERIN EX PADS: 1.0000 "application " | MEDICATED_PAD | CUTANEOUS | Status: DC | PRN

## 2020-01-10 MED ORDER — ONDANSETRON HCL 4 MG/2ML IJ SOLN
INTRAMUSCULAR | Status: DC | PRN
  Administered 2020-01-10: 4 mg via INTRAVENOUS

## 2020-01-10 MED ORDER — SIMETHICONE 80 MG PO CHEW: 80.0000 mg | CHEWABLE_TABLET | ORAL | Status: DC | PRN

## 2020-01-10 MED ORDER — ACETAMINOPHEN 500 MG PO TABS
1000.0000 mg | ORAL_TABLET | Freq: Four times a day (QID) | ORAL | Status: AC
  Administered 2020-01-10 – 2020-01-11 (×3): 1000 mg via ORAL
  Filled 2020-01-10 (×3): qty 2

## 2020-01-10 MED ORDER — STERILE WATER FOR IRRIGATION IR SOLN
Status: DC | PRN
  Administered 2020-01-10: 1

## 2020-01-10 MED ORDER — DIPHENHYDRAMINE HCL 25 MG PO CAPS: 25.0000 mg | ORAL_CAPSULE | Freq: Four times a day (QID) | ORAL | Status: DC | PRN

## 2020-01-10 MED ORDER — LACTATED RINGERS AMNIOINFUSION: INTRAVENOUS | Status: DC

## 2020-01-10 MED ORDER — OXYTOCIN 40 UNITS IN NORMAL SALINE INFUSION - SIMPLE MED
INTRAVENOUS | Status: DC | PRN
  Administered 2020-01-10: 500 mL via INTRAVENOUS

## 2020-01-10 MED ORDER — OXYTOCIN 40 UNITS IN NORMAL SALINE INFUSION - SIMPLE MED
INTRAVENOUS | Status: AC
  Filled 2020-01-10: qty 1000

## 2020-01-10 MED ORDER — NALBUPHINE HCL 10 MG/ML IJ SOLN
5.0000 mg | Freq: Once | INTRAMUSCULAR | Status: AC | PRN
  Administered 2020-01-10: 5 mg via INTRAVENOUS
  Filled 2020-01-10: qty 1

## 2020-01-10 MED ORDER — FENTANYL CITRATE (PF) 100 MCG/2ML IJ SOLN: 25.0000 ug | INTRAMUSCULAR | Status: DC | PRN

## 2020-01-10 MED ORDER — TETANUS-DIPHTH-ACELL PERTUSSIS 5-2.5-18.5 LF-MCG/0.5 IM SUSP: 0.5000 mL | Freq: Once | INTRAMUSCULAR | Status: DC

## 2020-01-10 MED ORDER — NALBUPHINE HCL 10 MG/ML IJ SOLN: 5.0000 mg | Freq: Once | INTRAMUSCULAR | Status: AC | PRN

## 2020-01-10 MED ORDER — OXYCODONE HCL 5 MG PO TABS
5.0000 mg | ORAL_TABLET | ORAL | Status: DC | PRN
  Administered 2020-01-11: 5 mg via ORAL
  Administered 2020-01-12: 10 mg via ORAL
  Filled 2020-01-10 (×2): qty 2
  Filled 2020-01-10: qty 1

## 2020-01-10 MED ORDER — LACTATED RINGERS IV SOLN: INTRAVENOUS | Status: DC

## 2020-01-10 MED ORDER — ZOLPIDEM TARTRATE 5 MG PO TABS: 5.0000 mg | ORAL_TABLET | Freq: Every evening | ORAL | Status: DC | PRN

## 2020-01-10 MED ORDER — DEXAMETHASONE SODIUM PHOSPHATE 10 MG/ML IJ SOLN
INTRAMUSCULAR | Status: DC | PRN
  Administered 2020-01-10: 10 mg via INTRAVENOUS

## 2020-01-10 MED ORDER — DEXAMETHASONE SODIUM PHOSPHATE 10 MG/ML IJ SOLN
INTRAMUSCULAR | Status: AC
  Filled 2020-01-10: qty 1

## 2020-01-10 MED ORDER — SODIUM CHLORIDE 0.9 % IR SOLN
Status: DC | PRN
  Administered 2020-01-10: 1

## 2020-01-10 MED ORDER — KETOROLAC TROMETHAMINE 30 MG/ML IJ SOLN
30.0000 mg | Freq: Four times a day (QID) | INTRAMUSCULAR | Status: AC | PRN
  Administered 2020-01-10: 30 mg via INTRAMUSCULAR

## 2020-01-10 MED ORDER — NALOXONE HCL 0.4 MG/ML IJ SOLN: 0.4000 mg | INTRAMUSCULAR | Status: DC | PRN

## 2020-01-10 MED ORDER — PRENATAL MULTIVITAMIN CH
1.0000 | ORAL_TABLET | Freq: Every day | ORAL | Status: DC
  Administered 2020-01-10 – 2020-01-12 (×3): 1 via ORAL
  Filled 2020-01-10 (×3): qty 1

## 2020-01-10 MED ORDER — DIPHENHYDRAMINE HCL 25 MG PO CAPS
25.0000 mg | ORAL_CAPSULE | ORAL | Status: DC | PRN
  Administered 2020-01-10: 25 mg via ORAL
  Filled 2020-01-10: qty 1

## 2020-01-10 MED ORDER — OXYTOCIN 40 UNITS IN NORMAL SALINE INFUSION - SIMPLE MED
1.0000 m[IU]/min | INTRAVENOUS | Status: DC
  Administered 2020-01-10: 2 m[IU]/min via INTRAVENOUS
  Filled 2020-01-10: qty 1000

## 2020-01-10 SURGICAL SUPPLY — 36 items
BENZOIN TINCTURE PRP APPL 2/3 (GAUZE/BANDAGES/DRESSINGS) ×2 IMPLANT
CHLORAPREP W/TINT 26ML (MISCELLANEOUS) ×3 IMPLANT
CLAMP CORD UMBIL (MISCELLANEOUS) IMPLANT
CLOSURE WOUND 1/2 X4 (GAUZE/BANDAGES/DRESSINGS) ×1
CLOTH BEACON ORANGE TIMEOUT ST (SAFETY) ×3 IMPLANT
DRSG OPSITE POSTOP 4X10 (GAUZE/BANDAGES/DRESSINGS) ×3 IMPLANT
ELECT REM PT RETURN 9FT ADLT (ELECTROSURGICAL) ×3
ELECTRODE REM PT RTRN 9FT ADLT (ELECTROSURGICAL) ×1 IMPLANT
EXTRACTOR VACUUM KIWI (MISCELLANEOUS) IMPLANT
GLOVE BIO SURGEON STRL SZ 6.5 (GLOVE) ×2 IMPLANT
GLOVE BIO SURGEONS STRL SZ 6.5 (GLOVE) ×1
GLOVE BIOGEL PI IND STRL 7.0 (GLOVE) ×1 IMPLANT
GLOVE BIOGEL PI INDICATOR 7.0 (GLOVE) ×2
GOWN STRL REUS W/TWL LRG LVL3 (GOWN DISPOSABLE) ×6 IMPLANT
KIT ABG SYR 3ML LUER SLIP (SYRINGE) IMPLANT
NDL HYPO 25X5/8 SAFETYGLIDE (NEEDLE) IMPLANT
NEEDLE HYPO 25X5/8 SAFETYGLIDE (NEEDLE) IMPLANT
NS IRRIG 1000ML POUR BTL (IV SOLUTION) ×3 IMPLANT
PACK C SECTION WH (CUSTOM PROCEDURE TRAY) ×3 IMPLANT
PAD OB MATERNITY 4.3X12.25 (PERSONAL CARE ITEMS) ×3 IMPLANT
PENCIL SMOKE EVAC W/HOLSTER (ELECTROSURGICAL) ×3 IMPLANT
RTRCTR C-SECT PINK 25CM LRG (MISCELLANEOUS) ×3 IMPLANT
STRIP CLOSURE SKIN 1/2X4 (GAUZE/BANDAGES/DRESSINGS) ×1 IMPLANT
SUT CHROMIC 1 CTX 36 (SUTURE) ×6 IMPLANT
SUT PLAIN 0 NONE (SUTURE) IMPLANT
SUT PLAIN 2 0 XLH (SUTURE) ×3 IMPLANT
SUT VIC AB 0 CT1 27 (SUTURE) ×4
SUT VIC AB 0 CT1 27XBRD ANBCTR (SUTURE) ×2 IMPLANT
SUT VIC AB 2-0 CT1 27 (SUTURE) ×2
SUT VIC AB 2-0 CT1 TAPERPNT 27 (SUTURE) ×1 IMPLANT
SUT VIC AB 3-0 CT1 27 (SUTURE)
SUT VIC AB 3-0 CT1 TAPERPNT 27 (SUTURE) IMPLANT
SUT VIC AB 4-0 KS 27 (SUTURE) ×3 IMPLANT
TOWEL OR 17X24 6PK STRL BLUE (TOWEL DISPOSABLE) ×3 IMPLANT
TRAY FOLEY W/BAG SLVR 14FR LF (SET/KITS/TRAYS/PACK) ×3 IMPLANT
WATER STERILE IRR 1000ML POUR (IV SOLUTION) ×3 IMPLANT

## 2020-01-10 NOTE — Lactation Note (Addendum)
This note was copied from a baby's chart. Lactation Consultation Note  Patient Name: Colleen [name] IRJJO'A Date: 01/10/2020 Reason for consult: Initial assessment P2, 9 hour ETI female infant. Mom's hx: Rheumatoid arthritis, IVF, and on hydroxchloroquine -L2 safe with breastfeeding .  Per parents infant had 4 voids and 3 stools since birth. Mom has made  attempts to latch infant at breast. Per mom, she has been taught hand expression by RN. Mom was given:  a hand pump, breast shells and 24 mm NS by RN due to having flat nipples. Infant has not latched to breast since birth, per mom, she had difficulties latching her 32 year old and had to use a NS. She mostly pumped  and stopped breastfeed after 2 weeks postpartum, she had retained placenta and a low milk supply. Per mom, when she used the NS in the hospital in [facility] they never told her to use the DEBP after using a NS, LC explained it will help with establishing and stimulating her milk supply. LC ask mom to pre-pump breast, prior to latching infant, mom attempted to latch infant on her right breast using the football hold without the 24 mm NS at first, infant latched for 2 minutes then stopped, becoming fussy, he was on and off breast not sustaining latch. Mom pre-pumped again, applying 24 mm NS, infant sustained latch and breastfeed for 15 minutes LC notice colostrum in NS when infant came off breast. Mom will continue to work with infant latching at breast. Mom will try one time tomorrow to latch infant without NS.  Mom knows to breast feed infant according to hunger cues, 8 to 12 times within 24 hours on demand as much as infant wants to breastfeed and not go past 3 hours without breastfeeding infant. Mom knows to call RN or LC if she has questions, concerns or needs assistance with latching infant at breast. Mom knows to use DEBP every 3 hours for 15 minutes on initial setting and hand express after pumping giving infant back any  EBM. Reviewed Baby & Me book's Breastfeeding Basics.  Mom made aware of O/P services, breastfeeding support groups, community resources, and our phone # for post-discharge questions.    Maternal Data Formula Feeding for Exclusion: Yes Reason for exclusion: Mother's choice to formula and breast feed on admission Has patient been taught Hand Expression?: Yes Does the patient have breastfeeding experience prior to this delivery?: Yes  Feeding Feeding Type: Breast Fed  LATCH Score Latch: Repeated attempts needed to sustain latch, nipple held in mouth throughout feeding, stimulation needed to elicit sucking reflex.  Audible Swallowing: A few with stimulation  Type of Nipple: Flat  Comfort (Breast/Nipple): Soft / non-tender  Hold (Positioning): Assistance needed to correctly position infant at breast and maintain latch.  LATCH Score: 6  Interventions Interventions: Breast feeding basics reviewed;Breast compression;Adjust position;Assisted with latch;Support pillows;Pre-pump if needed;Shells;Hand express;Position options;Expressed milk;DEBP;Hand pump  Lactation Tools Discussed/Used Tools: Shells Nipple shield size: 24 Shell Type: Other (comment)(flat nipples) WIC Program: No Pump Review: Setup, frequency, and cleaning;Milk Storage Initiated by:: Danelle Earthly, IBCLC Date initiated:: 01/10/20   Consult Status Consult Status: Follow-up Date: 01/11/20 Follow-up type: In-patient    Danelle Earthly 01/10/2020, 9:08 PM

## 2020-01-10 NOTE — Anesthesia Postprocedure Evaluation (Signed)
Anesthesia Post Note  Patient: Colleen [name]  Procedure(s) Performed: CESAREAN SECTION (N/A Abdomen)     Patient location during evaluation: PACU Anesthesia Type: Epidural Level of consciousness: awake and alert Pain management: pain level controlled Vital Signs Assessment: post-procedure vital signs reviewed and stable Respiratory status: spontaneous breathing and respiratory function stable Cardiovascular status: blood pressure returned to baseline and stable Postop Assessment: epidural receding Anesthetic complications: no    Last Vitals:  Vitals:   01/10/20 1340 01/10/20 1500  BP: 120/77 115/77  Pulse: 73 72  Resp: 19 18  Temp: 36.7 C 36.9 C  SpO2: 97% 98%    Last Pain:  Vitals:   01/10/20 1500  TempSrc: Oral  PainSc:    Pain Goal:                   Kennieth Rad

## 2020-01-10 NOTE — Progress Notes (Signed)
Patient ID: Colleen [name], female   DOB: 31-Jan-1986, 34 y.o.   MRN: [id number] Assumed care from Dr. Ellyn Hack when patient found to be completely dilated.  Attempted pushing about 15 minutes with no significant progress and station high at 0.  FSE applied to monitor variable and amnioinfusion begun  Pt placed in exaggerated Sims on peanut and will see if baby will descend more before resuming pushing.

## 2020-01-10 NOTE — Progress Notes (Signed)
Patient ID: Colleen [name], female   DOB: Feb 27, 1986, 33 y.o.   MRN: [id number] Pt allowed to labor down 1 1/2 hour and then resumed pushing because baby began to have deep variable decelerations  afeb VSS FHR category 2 with pushing, but occasional late and prolonged decelerations  Attempted pushing for over one hour and fetal station never able to descend beyond a +1  D/w pt and husband that with no real progress and continued variable decelerations I feel we should proceed with c-section.  I suspect baby is in OP lie and possibly larger than last baby.  Reviewed the c-section risks and benefits in detail including bleeding, infection, and possible damage to bowel and bladder.  Ready to proceed.  Have notified OR and turned off pitocin, will proceed when OR ready.

## 2020-01-10 NOTE — Progress Notes (Addendum)
Patient ID: Colleen [name], female   DOB: Jun 19, 1986, 34 y.o.   MRN: [id number]  IOL PIH, nl labs  Comfortable w epidural, but some pressure  AFVSS gen NAD FHTs 130-140, some variables/early decels, mod var, category 1-2 toco q 2-4, some coupling, appropriate mVUs  SVE 6/90/-1 per RN  Continue close monitoring, IOL, expect SVD

## 2020-01-10 NOTE — Transfer of Care (Signed)
Immediate Anesthesia Transfer of Care Note  Patient: Colleen [name]  Procedure(s) Performed: CESAREAN SECTION (N/A Abdomen)  Patient Location: PACU  Anesthesia Type:Epidural  Level of Consciousness: awake, alert  and oriented  Airway & Oxygen Therapy: Patient Spontanous Breathing  Post-op Assessment: Report given to RN and Post -op Vital signs reviewed and stable  Post vital signs: Reviewed and stable  Last Vitals:  Vitals Value Taken Time  BP    Temp    Pulse 89 01/10/20 1202  Resp 18 01/10/20 1202  SpO2 100 % 01/10/20 1202  Vitals shown include unvalidated device data.  Last Pain:  Vitals:   01/10/20 1000  TempSrc:   PainSc: 0-No pain         Complications: No apparent anesthesia complications

## 2020-01-10 NOTE — Op Note (Addendum)
Operative Note    Preoperative Diagnosis Term pregnancy at 38+ weeks Arrest of descent Persistent category 2 tracing  Postoperative Diagnosis Same  Procedure Primary low transverse c-section with 2 layer closure of uterus  Surgeon Huel Cote, MD Jerilynn Birkenhead, MD  Anesthesia Epidural  Fluids: EBL  UOP clear IVF LR  Findings A viable female infant in the vertex OP presentation.  Nuchal cord x 1.  Apgars 9,10.  Weight pending.  LUS very edematous, but otherwise anatomy WNL  Specimen Placenta to L&D  Procedure Note Patient was taken to the operating room where epidural anesthesia was found to be adequate by Allis clamp test. She was prepped and draped in the normal sterile fashion in the dorsal supine position with a leftward tilt. An appropriate time out was performed. A Pfannenstiel skin incision was then made with the scalpel and carried through to the underlying layer of fascia by sharp dissection and Bovie cautery. The fascia was nicked in the midline and the incision was extended laterally with Mayo scissors. The inferior aspect of the incision was grasped Coker clamps and dissected off the underlying rectus muscles. In a similar fashion the superior aspect was dissected off the rectus muscles. Rectus muscles were separated in the midline and the peritoneal cavity entered bluntly. The peritoneal incision was then extended both superiorly and inferiorly with careful attention to avoid both bowel and bladder. The Alexis self-retaining wound retractor was then placed within the incision and the lower uterine segment exposed. The bladder flap was developed with Metzenbaum scissors and pushed away from the lower uterine segment. The lower uterine segment was then incised in a transverse fashion and the cavity itself entered bluntly. The incision was extended bluntly. The infant's head was then lifted and delivered from the incision without difficulty. The  remainder of the infant delivered and the nose and mouth bulb suctioned with the cord clamped and cut as well. The infant was handed off to the waiting pediatricians. The placenta was then spontaneously expressed from the uterus and the uterus cleared of all clots and debris with moist lap sponge. The uterine incision was then repaired in 2 layers the first layer was a running locked layer 1-0 chromic and the second an imbricating layer of the same suture.  THe LUS was noted to be edematous.  The tubes and ovaries were inspected and the gutters cleared of all clots and debris. The uterine incision was inspected and found to be hemostatic. All instruments and sponges as well as the Alexis retractor were then removed from the abdomen. The rectus muscles and peritoneum were then reapproximated with several interrupted mattress sutures of 2-0 Vicryl. The fascia was then closed with 0 Vicryl in a running fashion. Subcutaneous tissue was reapproximated with 3-0 plain in a running fashion. The skin was closed with a subcuticular stitch of 4-0 Vicryl on a Keith needle and then reinforced with benzoin and Steri-Strips. At the conclusion of the procedure all instruments and sponge counts were correct. Patient was taken to the recovery room in good condition with her baby accompanying her skin to skin.   D/w pt circumcision and they plan with pediatrician

## 2020-01-10 NOTE — Progress Notes (Signed)
Patient ID: Colleen [name], female   DOB: [birth year], 34 y.o.   MRN: [id number] IOL for PIH, nl labs  Comfortable with epidural  AFVSS 107-125/59-80 (113/65) gen NAD FHTs - 130's, mod var, + accels, category 1 toco irr, not tracing well  IUPC placed w/o diff or comp.  SVE 5/80/-2  Continue IOL. Expect SVD

## 2020-01-10 NOTE — Progress Notes (Signed)
Patient ID: Colleen [name], female   DOB: 04/30/1986, 34 y.o.   MRN: [id number]   IOL PIH, nl labs  Increasing variables/early decels, mod var, category 2 SVE 8, change to 9 in short amount of time - still 0 station - but feels like has room.

## 2020-01-11 LAB — CBC
HCT: 32.3 % — ABNORMAL LOW (ref 36.0–46.0)
Hemoglobin: 10.5 g/dL — ABNORMAL LOW (ref 12.0–15.0)
MCH: 32 pg (ref 26.0–34.0)
MCHC: 32.5 g/dL (ref 30.0–36.0)
MCV: 98.5 fL (ref 80.0–100.0)
Platelets: 234 10*3/uL (ref 150–400)
RBC: 3.28 MIL/uL — ABNORMAL LOW (ref 3.87–5.11)
RDW: 13 % (ref 11.5–15.5)
WBC: 17.3 10*3/uL — ABNORMAL HIGH (ref 4.0–10.5)
nRBC: 0 % (ref 0.0–0.2)

## 2020-01-11 LAB — BIRTH TISSUE RECOVERY COLLECTION (PLACENTA DONATION)

## 2020-01-11 NOTE — Progress Notes (Signed)
Subjective: Postpartum Day #1: Cesarean Delivery Patient reports incisional pain and tolerating PO. Foley out but hasn't voided yet   Objective: Vital signs in last 24 hours: Temp:  [98 F (36.7 C)-98.5 F (36.9 C)] 98.4 F (36.9 C) (02/12 0500) Pulse Rate:  [71-101] 77 (02/11 2128) Resp:  [14-23] 20 (02/12 0500) BP: (98-126)/(60-100) 111/60 (02/12 0500) SpO2:  [97 %-100 %] 98 % (02/11 2128)  Physical Exam:  General: alert Lochia: appropriate Uterine Fundus: firm Incision: dressing C/D/I   Recent Labs    01/09/20 1910 01/11/20 0528  HGB 11.8* 10.5*  HCT 35.0* 32.3*    Assessment/Plan: Status post Cesarean section. Doing well postoperatively.  Continue current care, ambulate.  Leighton Roach Shawnte Demarest 01/11/2020, 8:32 AM

## 2020-01-11 NOTE — Lactation Note (Signed)
This note was copied from a baby's chart. Lactation Consultation Note  Patient Name: Colleen [name] [name] Date: 01/11/2020 Reason for consult: Follow-up assessment;Difficult latch;Early term 63-38.6wks Baby is 22 hours old/6% weight loss.  Parents started supplementing last night.  Mom called out for feeding assist.  Assisted with positioning baby skin to skin in football hold.  Mom has already applied 24 mm nipple shield.  Baby latched well after a few attempts.  I observed him feed off and on for 10 minutes.  Baby was sleepy and needed constant stimulation.  Mom is also using breast massage.  Mom states she has been pumping and obtaining drops of colostrum she finger feeds to baby.  Plan is to breastfeed with cues using shield if helpful, post pump every 3 hours x 15 minutes and supplement baby with expressed milk and formula.  Encouraged to call prn.  Maternal Data    Feeding Feeding Type: Breast Fed  LATCH Score Latch: Grasps breast easily, tongue down, lips flanged, rhythmical sucking.  Audible Swallowing: A few with stimulation  Type of Nipple: Everted at rest and after stimulation  Comfort (Breast/Nipple): Soft / non-tender  Hold (Positioning): Assistance needed to correctly position infant at breast and maintain latch.  LATCH Score: 8  Interventions    Lactation Tools Discussed/Used Tools: Nipple Shields Nipple shield size: 24   Consult Status Consult Status: Follow-up Date: 01/12/20 Follow-up type: In-patient    Colleen [name] 01/11/2020, 10:03 AM

## 2020-01-12 MED ORDER — OXYCODONE HCL 5 MG PO TABS: 5.0000 mg | ORAL_TABLET | ORAL | 0 refills | Status: DC | PRN

## 2020-01-12 MED ORDER — IBUPROFEN 800 MG PO TABS: 800.0000 mg | ORAL_TABLET | Freq: Three times a day (TID) | ORAL | 0 refills | Status: DC

## 2020-01-12 NOTE — Discharge Summary (Signed)
OB Discharge Summary     Patient Name: Colleen [name] DOB: 03-09-1986 MRN: [id number]  Date of admission: 01/09/2020 Delivering MD: Paula Compton   Date of discharge: 01/12/2020  Admitting diagnosis: Pregnancy [Z34.90] S/P primary low transverse C-section [G25.427] Intrauterine pregnancy: [redacted]w[redacted]d     Secondary diagnosis:  Active Problems:   Pregnancy   S/P primary low transverse C-section      Discharge diagnosis: Term Pregnancy Delivered, Gestational Hypertension and arrest of descent                                                                                                Hospital course:  Induction of Labor With Cesarean Section  34 y.o. yo G2P2002 at [redacted]w[redacted]d was admitted to the hospital 01/09/2020 for induction of labor due to [facility]. Patient had a labor course significant for arrest of descent. The patient went for cesarean section due to Arrest of Descent, and delivered a Viable infant,01/10/2020  Membrane Rupture Time/Date: 10:35 PM ,01/09/2020   Details of operation can be found in separate operative Note.  Patient had an uncomplicated postpartum course, normal BP postpartum. She is ambulating, tolerating a regular diet, passing flatus, and urinating well.  Patient is discharged home in stable condition on 01/12/20.                                    Physical exam  Vitals:   01/11/20 0500 01/11/20 1402 01/11/20 2042 01/12/20 0606  BP: 111/60 116/72 120/73 107/70  Pulse:  75 69 62  Resp: 20 16 18 16   Temp: 98.4 F (36.9 C) 98 F (36.7 C) 97.8 F (36.6 C) 98 F (36.7 C)  TempSrc: Oral Oral Oral Oral  SpO2:  100%    Weight:      Height:       General: alert Lochia: appropriate Uterine Fundus: firm Incision: Healing well with no significant drainage  Labs: Lab Results  Component Value Date   WBC 17.3 (H) 01/11/2020   HGB 10.5 (L) 01/11/2020   HCT 32.3 (L) 01/11/2020   MCV 98.5 01/11/2020   PLT 234 01/11/2020   CMP Latest Ref Rng & Units 01/09/2020  Glucose 70  - 99 mg/dL 88  BUN 6 - 20 mg/dL 8  Creatinine 0.44 - 1.00 mg/dL 0.68  Sodium 135 - 145 mmol/L 131(L)  Potassium 3.5 - 5.1 mmol/L 3.7  Chloride 98 - 111 mmol/L 103  CO2 22 - 32 mmol/L 18(L)  Calcium 8.9 - 10.3 mg/dL 8.7(L)  Total Protein 6.5 - 8.1 g/dL 5.8(L)  Total Bilirubin 0.3 - 1.2 mg/dL 0.4  Alkaline Phos 38 - 126 U/L 135(H)  AST 15 - 41 U/L 17  ALT 0 - 44 U/L 14    Discharge instruction: per After Visit Summary and "Baby and Me Booklet".  After visit meds:  Allergies as of 01/12/2020   No Known Allergies     Medication List    STOP taking these medications   aspirin 81 MG chewable tablet     TAKE  these medications   acetaminophen 500 MG tablet Commonly known as: TYLENOL Take 1,000 mg by mouth every 6 (six) hours as needed for mild pain or headache.   famotidine 20 MG tablet Commonly known as: PEPCID Take 20 mg by mouth daily as needed for heartburn or indigestion.   ferrous sulfate 325 (65 FE) MG tablet Take 325 mg by mouth daily with breakfast.   hydroxychloroquine 200 MG tablet Commonly known as: PLAQUENIL Take 300 mg by mouth daily.   ibuprofen 800 MG tablet Commonly known as: ADVIL Take 1 tablet (800 mg total) by mouth every 8 (eight) hours.   oxyCODONE 5 MG immediate release tablet Commonly known as: Oxy IR/ROXICODONE Take 1 tablet (5 mg total) by mouth every 4 (four) hours as needed for severe pain.   prenatal multivitamin Tabs tablet Take 1 tablet by mouth daily at 12 noon.       Diet: routine diet  Activity: Advance as tolerated. Pelvic rest for 6 weeks.   Outpatient follow up:2 weeks  Newborn Data: Live born female  Birth Weight: 7 lb 11.6 oz (3505 g) APGAR: 9, 10  Newborn Delivery   Birth date/time: 01/10/2020 11:13:00 Delivery type: C-Section, Low Transverse Trial of labor: Yes C-section categorization: Primary      Baby Feeding: Breast Disposition:home with mother   01/12/2020 Zenaida Niece, MD

## 2020-01-12 NOTE — Progress Notes (Signed)
POD #2 LTCS Doing well, wants to go home today Afeb, VSS, all BP normal Abd- soft, fundus firm, incision intact Discharge home

## 2020-01-12 NOTE — Discharge Instructions (Signed)
As per discharge pamphlet °

## 2020-01-12 NOTE — Lactation Note (Signed)
This note was copied from a baby's chart. Lactation Consultation Note  Patient Name: Colleen [name] ANVBT'Y Date: 01/12/2020   Attempt to see.  Has just been d/c  Maternal Data    Feeding Feeding Type: Formula Nipple Type: Slow - flow  LATCH Score                   Interventions    Lactation Tools Discussed/Used     Consult Status      Drystan Reader Michaelle Copas 01/12/2020, 3:14 PM

## 2020-01-14 DIAGNOSIS — R634 Abnormal weight loss: Secondary | ICD-10-CM | POA: Diagnosis not present

## 2020-01-14 DIAGNOSIS — Z00110 Health examination for newborn under 8 days old: Secondary | ICD-10-CM | POA: Diagnosis not present

## 2020-01-18 DIAGNOSIS — Z0189 Encounter for other specified special examinations: Secondary | ICD-10-CM | POA: Diagnosis not present

## 2020-01-22 DIAGNOSIS — R309 Painful micturition, unspecified: Secondary | ICD-10-CM | POA: Diagnosis not present

## 2020-02-22 DIAGNOSIS — M0609 Rheumatoid arthritis without rheumatoid factor, multiple sites: Secondary | ICD-10-CM | POA: Diagnosis not present

## 2020-02-22 DIAGNOSIS — Z79899 Other long term (current) drug therapy: Secondary | ICD-10-CM | POA: Diagnosis not present

## 2020-03-07 DIAGNOSIS — Z1389 Encounter for screening for other disorder: Secondary | ICD-10-CM | POA: Diagnosis not present

## 2020-03-07 DIAGNOSIS — Z3009 Encounter for other general counseling and advice on contraception: Secondary | ICD-10-CM | POA: Diagnosis not present

## 2020-03-07 DIAGNOSIS — Z124 Encounter for screening for malignant neoplasm of cervix: Secondary | ICD-10-CM | POA: Diagnosis not present

## 2020-06-25 DIAGNOSIS — F53 Postpartum depression: Secondary | ICD-10-CM | POA: Diagnosis not present

## 2020-06-25 DIAGNOSIS — N898 Other specified noninflammatory disorders of vagina: Secondary | ICD-10-CM | POA: Diagnosis not present

## 2020-07-14 DIAGNOSIS — F4320 Adjustment disorder, unspecified: Secondary | ICD-10-CM | POA: Diagnosis not present

## 2020-07-28 DIAGNOSIS — F4320 Adjustment disorder, unspecified: Secondary | ICD-10-CM | POA: Diagnosis not present

## 2020-08-11 DIAGNOSIS — F4320 Adjustment disorder, unspecified: Secondary | ICD-10-CM | POA: Diagnosis not present

## 2020-09-01 DIAGNOSIS — F4320 Adjustment disorder, unspecified: Secondary | ICD-10-CM | POA: Diagnosis not present

## 2020-09-15 DIAGNOSIS — F4320 Adjustment disorder, unspecified: Secondary | ICD-10-CM | POA: Diagnosis not present

## 2020-09-29 DIAGNOSIS — F4320 Adjustment disorder, unspecified: Secondary | ICD-10-CM | POA: Diagnosis not present

## 2020-10-13 DIAGNOSIS — F4320 Adjustment disorder, unspecified: Secondary | ICD-10-CM | POA: Diagnosis not present

## 2020-11-03 DIAGNOSIS — F4320 Adjustment disorder, unspecified: Secondary | ICD-10-CM | POA: Diagnosis not present

## 2020-11-20 DIAGNOSIS — Z79899 Other long term (current) drug therapy: Secondary | ICD-10-CM | POA: Diagnosis not present

## 2020-11-20 DIAGNOSIS — M0609 Rheumatoid arthritis without rheumatoid factor, multiple sites: Secondary | ICD-10-CM | POA: Diagnosis not present

## 2020-11-24 DIAGNOSIS — F4320 Adjustment disorder, unspecified: Secondary | ICD-10-CM | POA: Diagnosis not present

## 2020-12-15 DIAGNOSIS — F4320 Adjustment disorder, unspecified: Secondary | ICD-10-CM | POA: Diagnosis not present

## 2021-01-12 DIAGNOSIS — F4320 Adjustment disorder, unspecified: Secondary | ICD-10-CM | POA: Diagnosis not present

## 2021-03-09 DIAGNOSIS — Z130 Encounter for screening for diseases of the blood and blood-forming organs and certain disorders involving the immune mechanism: Secondary | ICD-10-CM | POA: Diagnosis not present

## 2021-03-09 DIAGNOSIS — Z01419 Encounter for gynecological examination (general) (routine) without abnormal findings: Secondary | ICD-10-CM | POA: Diagnosis not present

## 2021-03-09 DIAGNOSIS — Z1389 Encounter for screening for other disorder: Secondary | ICD-10-CM | POA: Diagnosis not present

## 2021-03-10 DIAGNOSIS — Z1151 Encounter for screening for human papillomavirus (HPV): Secondary | ICD-10-CM | POA: Diagnosis not present

## 2021-03-10 DIAGNOSIS — Z124 Encounter for screening for malignant neoplasm of cervix: Secondary | ICD-10-CM | POA: Diagnosis not present

## 2021-07-22 ENCOUNTER — Encounter (HOSPITAL_BASED_OUTPATIENT_CLINIC_OR_DEPARTMENT_OTHER): Payer: Self-pay

## 2021-07-22 ENCOUNTER — Emergency Department (HOSPITAL_BASED_OUTPATIENT_CLINIC_OR_DEPARTMENT_OTHER): Payer: BC Managed Care – PPO

## 2021-07-22 ENCOUNTER — Other Ambulatory Visit: Payer: Self-pay

## 2021-07-22 ENCOUNTER — Emergency Department (HOSPITAL_BASED_OUTPATIENT_CLINIC_OR_DEPARTMENT_OTHER)
Admission: EM | Admit: 2021-07-22 | Discharge: 2021-07-22 | Disposition: A | Discharge status: Home / Self Care | Payer: BC Managed Care – PPO | Attending: Emergency Medicine | Admitting: Emergency Medicine

## 2021-07-22 DIAGNOSIS — Y9302 Activity, running: Secondary | ICD-10-CM | POA: Diagnosis not present

## 2021-07-22 DIAGNOSIS — W010XXA Fall on same level from slipping, tripping and stumbling without subsequent striking against object, initial encounter: Secondary | ICD-10-CM | POA: Insufficient documentation

## 2021-07-22 DIAGNOSIS — S99912A Unspecified injury of left ankle, initial encounter: Secondary | ICD-10-CM | POA: Diagnosis not present

## 2021-07-22 DIAGNOSIS — S93402A Sprain of unspecified ligament of left ankle, initial encounter: Secondary | ICD-10-CM | POA: Insufficient documentation

## 2021-07-22 DIAGNOSIS — Z043 Encounter for examination and observation following other accident: Secondary | ICD-10-CM | POA: Diagnosis not present

## 2021-07-22 MED ORDER — CYCLOBENZAPRINE HCL 10 MG PO TABS: 10.0000 mg | ORAL_TABLET | Freq: Two times a day (BID) | ORAL | 0 refills | Status: DC | PRN

## 2021-07-22 NOTE — ED Triage Notes (Signed)
Pt states she fell while running 2 days ago injured left ankle-NAD-to triage in w/c

## 2021-07-22 NOTE — ED Notes (Signed)
Pt discharged to home. Discharge instructions have been discussed with patient and/or family members. Pt verbally acknowledges understanding d/c instructions, and endorses comprehension to checkout at registration before leaving.  °

## 2021-07-22 NOTE — ED Provider Notes (Signed)
[facility] EMERGENCY DEPARTMENT Provider Note   CSN: [id number] Arrival date & time: 07/22/21  1752     History Chief Complaint  Patient presents with   Ankle Injury    Colleen [name] is a 35 y.o. female.  The history is provided by the patient.  Ankle Injury This is a new problem. The current episode started 2 days ago. The problem occurs constantly. The problem has been gradually worsening. Associated symptoms comments: Was running and tripped on uneven pavement and left ankle rolled out and she fell.  Immediate pain to the left ankle that made her feel like she was gonna vomit.  She has been wearing a brace and trying to stay off of it but it has become more swollen and bruised.  No numbness and tingling of the toes and no pain in the knee or upper leg.  She has been able to stand on a little bit with her brace but it is painful.. The symptoms are aggravated by walking and standing. The symptoms are relieved by rest. Treatments tried: Elevation, ankle brace and rest. The treatment provided no relief.      Past Medical History:  Diagnosis Date   Arthritis     Patient Active Problem List   Diagnosis Date Noted   S/P primary low transverse C-section 01/10/2020   Pregnancy 01/09/2020   Traumatic injury during pregnancy in third trimester 12/28/2019    Past Surgical History:  Procedure Laterality Date   CESAREAN SECTION N/A 01/10/2020   Procedure: CESAREAN SECTION;  Surgeon: Huel Cote, MD;  Location: [facility] LD ORS;  Service: Obstetrics;  Laterality: N/A;   NO PAST SURGERIES       OB History     Gravida  2   Para  2   Term  2   Preterm  0   AB  0   Living  2      SAB  0   IAB  0   Ectopic  0   Multiple  0   Live Births  2           No family history on file.  Social History   Tobacco Use   Smoking status: Never   Smokeless tobacco: Never  Vaping Use   Vaping Use: Never used  Substance Use Topics   Alcohol use: Never    Drug use: Never    Home Medications Prior to Admission medications   Medication Sig Start Date End Date Taking? Authorizing Provider  acetaminophen (TYLENOL) 500 MG tablet Take 1,000 mg by mouth every 6 (six) hours as needed for mild pain or headache.    [provider]  famotidine (PEPCID) 20 MG tablet Take 20 mg by mouth daily as needed for heartburn or indigestion.    [provider]  ferrous sulfate 325 (65 FE) MG tablet Take 325 mg by mouth daily with breakfast.    [provider]  hydroxychloroquine (PLAQUENIL) 200 MG tablet Take 300 mg by mouth daily.    [provider]  ibuprofen (ADVIL) 800 MG tablet Take 1 tablet (800 mg total) by mouth every 8 (eight) hours. 01/12/20   Meisinger, Tawanna Cooler, MD  oxyCODONE (OXY IR/ROXICODONE) 5 MG immediate release tablet Take 1 tablet (5 mg total) by mouth every 4 (four) hours as needed for severe pain. 01/12/20   Meisinger, Tawanna Cooler, MD  Prenatal Vit-Fe Fumarate-FA (PRENATAL MULTIVITAMIN) TABS tablet Take 1 tablet by mouth daily at 12 noon. 12/29/19   Bovard-Stuckert, Jody,  MD    Allergies    Patient has no known allergies.  Review of Systems   Review of Systems  All other systems reviewed and are negative.  Physical Exam Updated Vital Signs BP 115/64 (BP Location: Right Arm)   Pulse 72   Temp 98.4 F (36.9 C) (Oral)   Resp 18   Ht 5\' 2"  (1.575 m)   Wt 69.4 kg   LMP 07/01/2021   SpO2 100%   BMI 27.98 kg/m   Physical Exam Vitals and nursing note reviewed.  Constitutional:      General: She is not in acute distress.    Appearance: Normal appearance. She is normal weight.  HENT:     Head: Normocephalic.  Cardiovascular:     Rate and Rhythm: Normal rate.  Pulmonary:     Effort: Pulmonary effort is normal.  Musculoskeletal:        General: Tenderness present.     Cervical back: Normal range of motion and neck supple.     Left ankle: Swelling and ecchymosis present. Tenderness present over the lateral  malleolus. No base of 5th metatarsal or proximal fibula tenderness. Decreased range of motion.     Left Achilles Tendon: Normal.  Skin:    General: Skin is warm and dry.  Neurological:     Mental Status: She is alert and oriented to person, place, and time. Mental status is at baseline.  Psychiatric:        Mood and Affect: Mood normal.        Behavior: Behavior normal.    ED Results / Procedures / Treatments   Labs (all labs ordered are listed, but only abnormal results are displayed) Labs Reviewed - No data to display  EKG None  Radiology DG Ankle Complete Left  Result Date: 07/22/2021 CLINICAL DATA:  Fall while running 2 days ago. EXAM: LEFT ANKLE COMPLETE - 3+ VIEW COMPARISON:  None. FINDINGS: There is no evidence of fracture, dislocation, or joint effusion. There is no evidence of arthropathy or other focal bone abnormality. Soft tissues are unremarkable. IMPRESSION: Negative. Electronically Signed   By: 07/24/2021 M.D.   On: 07/22/2021 18:28    Procedures Procedures   Medications Ordered in ED Medications - No data to display  ED Course  I have reviewed the triage vital signs and the nursing notes.  Pertinent labs & imaging results that were available during my care of the patient were reviewed by me and considered in my medical decision making (see chart for details).    MDM Rules/Calculators/A&P                           Patient with fall 2 days ago with ongoing pain and swelling in the lateral malleolus of the left ankle.  No knee or proximal fibular tenderness.  Plain films neg.  Will treat with cam walker and crutches and follow up  MDM   Amount and/or Complexity of Data Reviewed Tests in the radiology section of CPT: ordered and reviewed Independent visualization of images, tracings, or specimens: yes    Final Clinical Impression(s) / ED Diagnoses Final diagnoses:  Sprain of left ankle, unspecified ligament, initial encounter    Rx / DC  Orders ED Discharge Orders     None        07/24/2021, MD 07/22/21 1844

## 2021-08-06 ENCOUNTER — Ambulatory Visit: Payer: Self-pay

## 2021-08-06 ENCOUNTER — Ambulatory Visit: Payer: BC Managed Care – PPO | Admitting: Family Medicine

## 2021-08-06 ENCOUNTER — Other Ambulatory Visit: Payer: Self-pay

## 2021-08-06 VITALS — Ht 62.0 in | Wt 155.0 lb

## 2021-08-06 DIAGNOSIS — S93492A Sprain of other ligament of left ankle, initial encounter: Secondary | ICD-10-CM | POA: Diagnosis not present

## 2021-08-06 DIAGNOSIS — M25572 Pain in left ankle and joints of left foot: Secondary | ICD-10-CM

## 2021-08-06 NOTE — Patient Instructions (Signed)
Nice to meet you Please try the aircast  Please try the exercises  Please use ice as needed   Please send me a message in MyChart with any questions or updates.  Please see me back in 3 weeks.   --Dr. Jordan Likes

## 2021-08-06 NOTE — Assessment & Plan Note (Signed)
Initial injury on 8/24.  Has been in a cam walker and on crutches.  Does have changes of the ATFL to indicate sprain and an effusion ankle joint which is likely traumatic related.  Less likely for chondral lesion. -Counseled on home exercise therapy and supportive care. -Transition to Aircast and counseled on weaning out. -Could consider physical therapy.

## 2021-08-06 NOTE — Progress Notes (Signed)
  Colleen [name] - 35 y.o. female MRN [id number]  Date of birth: Feb 21, 1986  SUBJECTIVE:  Including CC & ROS.  No chief complaint on file.   Colleen [name] is a 35 y.o. female that is presenting with left ankle pain.  She had an injury on August 24.  She fell at that time while running.  She has had lateral sided pain.  She has been using the crutches and Cam walker.  The swelling and ecchymosis has improved.  Independent review of the left ankle x-ray from 8/24 shows no acute changes.   Review of Systems See HPI   HISTORY: Past Medical, Surgical, Social, and Family History Reviewed & Updated per EMR.   Pertinent Historical Findings include:  Past Medical History:  Diagnosis Date   Arthritis     Past Surgical History:  Procedure Laterality Date   CESAREAN SECTION N/A 01/10/2020   Procedure: CESAREAN SECTION;  Surgeon: Huel Cote, MD;  Location: [facility] LD ORS;  Service: Obstetrics;  Laterality: N/A;   NO PAST SURGERIES      No family history on file.  Social History   Socioeconomic History   Marital status: Married    Spouse name: Not on file   Number of children: Not on file   Years of education: Not on file   Highest education level: Not on file  Occupational History   Not on file  Tobacco Use   Smoking status: Never   Smokeless tobacco: Never  Vaping Use   Vaping Use: Never used  Substance and Sexual Activity   Alcohol use: Never   Drug use: Never   Sexual activity: Yes  Other Topics Concern   Not on file  Social History Narrative   Not on file   Social Determinants of Health   Financial Resource Strain: Not on file  Food Insecurity: Not on file  Transportation Needs: Not on file  Physical Activity: Not on file  Stress: Not on file  Social Connections: Not on file  Intimate Partner Violence: Not on file     PHYSICAL EXAM:  VS: Ht 5\' 2"  (1.575 m)   Wt 155 lb (70.3 kg)   BMI 28.35 kg/m  Physical Exam Gen: NAD, alert, cooperative  with exam, well-appearing   Limited ultrasound: Left ankle:  There appears to be an effusion observed on dynamic testing. No change of the talar dome. Normal-appearing posterior tibialis. There does appear to be effusion around the peroneal tendons at the lateral malleolus. Normal insertional peroneal brevis into the base of the fifth metatarsal. Changes of the ATFL observed  Summary: Images consistent with ankle sprain with an effusion of the ankle joint  Ultrasound and interpretation by , MD     ASSESSMENT & PLAN:   Sprain of anterior talofibular ligament of left ankle Initial injury on 8/24.  Has been in a cam walker and on crutches.  Does have changes of the ATFL to indicate sprain and an effusion ankle joint which is likely traumatic related.  Less likely for chondral lesion. -Counseled on home exercise therapy and supportive care. -Transition to Aircast and counseled on weaning out. -Could consider physical therapy.

## 2021-08-22 ENCOUNTER — Encounter: Payer: Self-pay | Admitting: Family Medicine

## 2021-08-23 ENCOUNTER — Emergency Department (HOSPITAL_BASED_OUTPATIENT_CLINIC_OR_DEPARTMENT_OTHER): Payer: BC Managed Care – PPO

## 2021-08-23 ENCOUNTER — Encounter (HOSPITAL_BASED_OUTPATIENT_CLINIC_OR_DEPARTMENT_OTHER): Payer: Self-pay

## 2021-08-23 ENCOUNTER — Emergency Department (HOSPITAL_BASED_OUTPATIENT_CLINIC_OR_DEPARTMENT_OTHER)
Admission: EM | Admit: 2021-08-23 | Discharge: 2021-08-23 | Disposition: A | Discharge status: Home / Self Care | Payer: BC Managed Care – PPO | Attending: Emergency Medicine | Admitting: Emergency Medicine

## 2021-08-23 ENCOUNTER — Other Ambulatory Visit: Payer: Self-pay

## 2021-08-23 DIAGNOSIS — R110 Nausea: Secondary | ICD-10-CM | POA: Insufficient documentation

## 2021-08-23 DIAGNOSIS — R519 Headache, unspecified: Secondary | ICD-10-CM | POA: Diagnosis not present

## 2021-08-23 DIAGNOSIS — R29818 Other symptoms and signs involving the nervous system: Secondary | ICD-10-CM | POA: Diagnosis not present

## 2021-08-23 LAB — CBC WITH DIFFERENTIAL/PLATELET
Abs Immature Granulocytes: 0.03 10*3/uL (ref 0.00–0.07)
Basophils Absolute: 0 10*3/uL (ref 0.0–0.1)
Basophils Relative: 0 %
Eosinophils Absolute: 0.1 10*3/uL (ref 0.0–0.5)
Eosinophils Relative: 1 %
HCT: 32.2 % — ABNORMAL LOW (ref 36.0–46.0)
Hemoglobin: 11 g/dL — ABNORMAL LOW (ref 12.0–15.0)
Immature Granulocytes: 0 %
Lymphocytes Relative: 16 %
Lymphs Abs: 1.3 10*3/uL (ref 0.7–4.0)
MCH: 30.9 pg (ref 26.0–34.0)
MCHC: 34.2 g/dL (ref 30.0–36.0)
MCV: 90.4 fL (ref 80.0–100.0)
Monocytes Absolute: 0.6 10*3/uL (ref 0.1–1.0)
Monocytes Relative: 7 %
Neutro Abs: 5.9 10*3/uL (ref 1.7–7.7)
Neutrophils Relative %: 76 %
Platelets: 224 10*3/uL (ref 150–400)
RBC: 3.56 MIL/uL — ABNORMAL LOW (ref 3.87–5.11)
RDW: 11.9 % (ref 11.5–15.5)
WBC: 7.9 10*3/uL (ref 4.0–10.5)
nRBC: 0 % (ref 0.0–0.2)

## 2021-08-23 LAB — BASIC METABOLIC PANEL
Anion gap: 3 — ABNORMAL LOW (ref 5–15)
BUN: 12 mg/dL (ref 6–20)
CO2: 21 mmol/L — ABNORMAL LOW (ref 22–32)
Calcium: 7.8 mg/dL — ABNORMAL LOW (ref 8.9–10.3)
Chloride: 110 mmol/L (ref 98–111)
Creatinine, Ser: 0.71 mg/dL (ref 0.44–1.00)
GFR, Estimated: >60 mL/min (ref >60)
Glucose, Bld: 95 mg/dL (ref 70–99)
Potassium: 4.2 mmol/L (ref 3.5–5.1)
Sodium: 134 mmol/L — ABNORMAL LOW (ref 135–145)

## 2021-08-23 MED ORDER — DIPHENHYDRAMINE HCL 50 MG/ML IJ SOLN
12.5000 mg | Freq: Once | INTRAMUSCULAR | Status: AC
  Administered 2021-08-23: 12.5 mg via INTRAVENOUS
  Filled 2021-08-23: qty 1

## 2021-08-23 MED ORDER — PROCHLORPERAZINE EDISYLATE 10 MG/2ML IJ SOLN
10.0000 mg | Freq: Once | INTRAMUSCULAR | Status: AC
  Administered 2021-08-23: 10 mg via INTRAVENOUS
  Filled 2021-08-23: qty 2

## 2021-08-23 MED ORDER — IOHEXOL 350 MG/ML SOLN
100.0000 mL | Freq: Once | INTRAVENOUS | Status: AC | PRN
  Administered 2021-08-23: 75 mL via INTRAVENOUS

## 2021-08-23 MED ORDER — SODIUM CHLORIDE 0.9 % IV BOLUS
1000.0000 mL | Freq: Once | INTRAVENOUS | Status: AC
  Administered 2021-08-23: 1000 mL via INTRAVENOUS

## 2021-08-23 NOTE — ED Notes (Signed)
EDP at bedside  

## 2021-08-23 NOTE — ED Notes (Signed)
Patient transported to CT 

## 2021-08-23 NOTE — Discharge Instructions (Addendum)
Imaging of your head is reassuring today.  It sounds as though you are having chronic migraines for which I recommend following up with your primary care provider.  Discuss this during your appointment in November to see if there is better treatment to decrease the frequency of these headaches.  You may continue to use Tylenol, ibuprofen as well as Excedrin.  Caffeine sometimes helps migraines as well as Benadryl.  You should come back to the emergency department if you have a sudden onset, crushing headache that you have never had before.  Visual changes, dizziness, nausea and vomiting are reasons to return to the emergency department.  Read the information about migraines attached your discharge papers for further information.  It was a pleasure to meet you and I hope that you feel better.

## 2021-08-23 NOTE — ED Triage Notes (Signed)
Pt arrives ambulatory to ED with c/o headache starting yesterday afternoon. Pt reports she has been taking medications OTC with no change in pain and some nausea. 800 mg ibuprofen, tylenol, and aleve.

## 2021-08-23 NOTE — ED Provider Notes (Signed)
[facility] EMERGENCY DEPARTMENT Provider Note   CSN: [id number] Arrival date & time: 08/23/21  1191     History Chief Complaint  Patient presents with   Headache    Colleen [name] is a 35 y.o. female with a past medical history of rheumatoid arthritis and frequent headaches presenting today with a complaint of a sudden onset headache that began last night.  Patient reports that she was standing when she felt a sudden onset headache in the middle of her head.  This headache was worsened with movement such as flexion and extension of the neck.  She was concerned because this is not like her typical headaches.  For the past few months she has been getting multiple headaches a day.  Mother with migraine disorder however she has never been diagnosed with it.  Does not have photo or phonophobia at this time and usual headaches do not present with the symptoms either.  This morning she became nauseous, never vomiting.  Denies visual disturbances.  Last night experienced some left arm numbness however that has since resolved.  Says she feels as though her speech is jumbled.  No falls.  Has been treating her pain with ibuprofen and Tylenol and this has not helped.  Usually her headaches go away when she stays in a dark room with a cold washrag however this did not help and she continued with a headache this morning.  Currently rates it 7/10.    Headache Associated symptoms: nausea   Associated symptoms: no dizziness, no ear pain, no fever, no numbness, no photophobia, no vomiting and no weakness       Past Medical History:  Diagnosis Date   Arthritis     Patient Active Problem List   Diagnosis Date Noted   Sprain of anterior talofibular ligament of left ankle 08/06/2021   S/P primary low transverse C-section 01/10/2020   Pregnancy 01/09/2020   Traumatic injury during pregnancy in third trimester 12/28/2019    Past Surgical History:  Procedure Laterality Date   CESAREAN  SECTION N/A 01/10/2020   Procedure: CESAREAN SECTION;  Surgeon: Huel Cote, MD;  Location: [facility] LD ORS;  Service: Obstetrics;  Laterality: N/A;   NO PAST SURGERIES       OB History     Gravida  2   Para  2   Term  2   Preterm  0   AB  0   Living  2      SAB  0   IAB  0   Ectopic  0   Multiple  0   Live Births  2           No family history on file.  Social History   Tobacco Use   Smoking status: Never   Smokeless tobacco: Never  Vaping Use   Vaping Use: Never used  Substance Use Topics   Alcohol use: Never   Drug use: Never    Home Medications Prior to Admission medications   Medication Sig Start Date End Date Taking? Authorizing Provider  acetaminophen (TYLENOL) 500 MG tablet Take 1,000 mg by mouth every 6 (six) hours as needed for mild pain or headache.    [provider]  cyclobenzaprine (FLEXERIL) 10 MG tablet Take 1 tablet (10 mg total) by mouth 2 (two) times daily as needed for muscle spasms. 07/22/21   Gwyneth Sprout, MD  famotidine (PEPCID) 20 MG tablet Take 20 mg by mouth daily as needed for heartburn or indigestion.  [provider]  ferrous sulfate 325 (65 FE) MG tablet Take 325 mg by mouth daily with breakfast.    [provider]  hydroxychloroquine (PLAQUENIL) 200 MG tablet Take 300 mg by mouth daily.    [provider]  ibuprofen (ADVIL) 800 MG tablet Take 1 tablet (800 mg total) by mouth every 8 (eight) hours. 01/12/20   Meisinger, Tawanna Cooler, MD  oxyCODONE (OXY IR/ROXICODONE) 5 MG immediate release tablet Take 1 tablet (5 mg total) by mouth every 4 (four) hours as needed for severe pain. 01/12/20   Meisinger, Tawanna Cooler, MD  Prenatal Vit-Fe Fumarate-FA (PRENATAL MULTIVITAMIN) TABS tablet Take 1 tablet by mouth daily at 12 noon. 12/29/19   Bovard-Stuckert, Augusto Gamble, MD    Allergies    Patient has no known allergies.  Review of Systems   Review of Systems  Constitutional:  Negative for chills and fever.  HENT:   Negative for ear pain.   Eyes:  Negative for photophobia and visual disturbance.  Respiratory:  Negative for shortness of breath.   Cardiovascular:  Negative for chest pain and palpitations.  Gastrointestinal:  Positive for nausea. Negative for vomiting.  Neurological:  Positive for speech difficulty and headaches. Negative for dizziness, weakness and numbness.  All other systems reviewed and are negative.  Physical Exam Updated Vital Signs BP 121/84 (BP Location: Right Arm)   Pulse 89   Temp 98.5 F (36.9 C) (Oral)   Resp 18   Ht 5\' 2"  (1.575 m)   Wt 70.3 kg   LMP 07/23/2021   SpO2 100%   BMI 28.35 kg/m   Physical Exam Vitals and nursing note reviewed.  Constitutional:      General: She is not in acute distress.    Appearance: Normal appearance. She is well-developed. She is not diaphoretic.  HENT:     Head: Normocephalic and atraumatic.     Mouth/Throat:     Mouth: Mucous membranes are moist.     Pharynx: Oropharynx is clear.  Eyes:     General: No scleral icterus.    Conjunctiva/sclera: Conjunctivae normal.     Pupils: Pupils are equal, round, and reactive to light.  Cardiovascular:     Rate and Rhythm: Normal rate and regular rhythm.     Heart sounds: Normal heart sounds.  Pulmonary:     Effort: Pulmonary effort is normal. No respiratory distress.     Breath sounds: Normal breath sounds. No wheezing.  Musculoskeletal:     Cervical back: Normal range of motion and neck supple.  Skin:    General: Skin is warm and dry.     Findings: No rash.  Neurological:     Mental Status: She is alert.     GCS: GCS eye subscore is 4. GCS verbal subscore is 5. GCS motor subscore is 5.     Cranial Nerves: No cranial nerve deficit (2 through 12 tested and grossly intact.).  Psychiatric:        Mood and Affect: Mood normal.        Speech: Speech normal.        Behavior: Behavior normal.    ED Results / Procedures / Treatments   Labs (all labs ordered are listed, but only  abnormal results are displayed) Labs Reviewed - No data to display  EKG None  Radiology CT Angio Head W or Wo Contrast  Result Date: 08/23/2021 CLINICAL DATA:  Neuro deficit, acute, stroke suspected. Rule out subarachnoid hemorrhage. Headache since yesterday. EXAM: CT ANGIOGRAPHY HEAD AND NECK  TECHNIQUE: Multidetector CT imaging of the head and neck was performed using the standard protocol during bolus administration of intravenous contrast. Multiplanar CT image reconstructions and MIPs were obtained to evaluate the vascular anatomy. Carotid stenosis measurements (when applicable) are obtained utilizing NASCET criteria, using the distal internal carotid diameter as the denominator. CONTRAST:  47mL OMNIPAQUE IOHEXOL 350 MG/ML SOLN FINDINGS: CTA NECK FINDINGS Aortic arch: Standard aortic branching. Streak and beam hardening artifact arising from a dense right-sided contrast bolus limits evaluation of the right subclavian artery. Within this limitation, no hemodynamically significant innominate or proximal subclavian artery stenosis is appreciated. Right carotid system: CCA and ICA patent within the neck without stenosis or significant atherosclerotic disease. No evidence of dissection Left carotid system: CCA and ICA patent within the neck without stenosis or significant atherosclerotic disease. No evidence of dissection. Vertebral arteries: Venous reflux limits evaluation of the proximal V1 right vertebral artery. Within this limitation, the vertebral arteries are codominant and patent within the neck without stenosis or evidence of dissection. Skeleton: No acute bony abnormality or aggressive osseous lesion. Other neck: No neck mass or cervical lymphadenopathy. Thyroid unremarkable. Upper chest: No consolidation within the imaged lung apices. Review of the MIP images confirms the above findings CTA HEAD FINDINGS Anterior circulation: The intracranial internal carotid arteries are patent. The M1 middle  cerebral arteries are patent. No M2 proximal branch occlusion or high-grade proximal stenosis is identified. The anterior cerebral arteries are patent. No intracranial aneurysm is identified. Small infundibulum arising from the supraclinoid right ICA. Posterior circulation: The intracranial vertebral arteries are patent. The basilar artery is patent. The posterior cerebral arteries are patent. Posterior communicating arteries are hypoplastic or absent bilaterally. Venous sinuses: Within the limitations of contrast timing, no convincing thrombus. Anatomic variants: As described. Review of the MIP images confirms the above findings IMPRESSION: CTA neck: Venous reflux limits evaluation of the proximal V1 right vertebral artery. Within this limitation, the common carotid, internal carotid and vertebral arteries are patent within the neck without appreciable stenosis or evidence of dissection. CTA head: 1. No intracranial large vessel occlusion or proximal high-grade arterial stenosis. 2. No intracranial aneurysm is identified. Electronically Signed   By: Jackey Loge D.O.   On: 08/23/2021 12:23   CT HEAD WO CONTRAST ( )  Result Date: 08/23/2021 CLINICAL DATA:  Headache since yesterday EXAM: CT HEAD WITHOUT CONTRAST TECHNIQUE: Contiguous axial images were obtained from the base of the skull through the vertex without intravenous contrast. COMPARISON:  None. FINDINGS: Brain: No evidence of acute infarction, hemorrhage, hydrocephalus, extra-axial collection or mass lesion/mass effect. Vascular: No hyperdense vessel or unexpected calcification. Skull: Normal. Negative for fracture or focal lesion. Sinuses/Orbits: No acute finding. Other: None. IMPRESSION: No acute intracranial pathology. No non-contrast CT findings to explain headache. Electronically Signed   By: Lauralyn Primes M.D.   On: 08/23/2021 10:45   CT ANGIO NECK W OR WO CONTRAST  Result Date: 08/23/2021 CLINICAL DATA:  Neuro deficit, acute, stroke  suspected. Rule out subarachnoid hemorrhage. Headache since yesterday. EXAM: CT ANGIOGRAPHY HEAD AND NECK TECHNIQUE: Multidetector CT imaging of the head and neck was performed using the standard protocol during bolus administration of intravenous contrast. Multiplanar CT image reconstructions and MIPs were obtained to evaluate the vascular anatomy. Carotid stenosis measurements (when applicable) are obtained utilizing NASCET criteria, using the distal internal carotid diameter as the denominator. CONTRAST:  7mL OMNIPAQUE IOHEXOL 350 MG/ML SOLN FINDINGS: CTA NECK FINDINGS Aortic arch: Standard aortic branching. Streak and beam hardening artifact arising from  a dense right-sided contrast bolus limits evaluation of the right subclavian artery. Within this limitation, no hemodynamically significant innominate or proximal subclavian artery stenosis is appreciated. Right carotid system: CCA and ICA patent within the neck without stenosis or significant atherosclerotic disease. No evidence of dissection Left carotid system: CCA and ICA patent within the neck without stenosis or significant atherosclerotic disease. No evidence of dissection. Vertebral arteries: Venous reflux limits evaluation of the proximal V1 right vertebral artery. Within this limitation, the vertebral arteries are codominant and patent within the neck without stenosis or evidence of dissection. Skeleton: No acute bony abnormality or aggressive osseous lesion. Other neck: No neck mass or cervical lymphadenopathy. Thyroid unremarkable. Upper chest: No consolidation within the imaged lung apices. Review of the MIP images confirms the above findings CTA HEAD FINDINGS Anterior circulation: The intracranial internal carotid arteries are patent. The M1 middle cerebral arteries are patent. No M2 proximal branch occlusion or high-grade proximal stenosis is identified. The anterior cerebral arteries are patent. No intracranial aneurysm is identified. Small  infundibulum arising from the supraclinoid right ICA. Posterior circulation: The intracranial vertebral arteries are patent. The basilar artery is patent. The posterior cerebral arteries are patent. Posterior communicating arteries are hypoplastic or absent bilaterally. Venous sinuses: Within the limitations of contrast timing, no convincing thrombus. Anatomic variants: As described. Review of the MIP images confirms the above findings IMPRESSION: CTA neck: Venous reflux limits evaluation of the proximal V1 right vertebral artery. Within this limitation, the common carotid, internal carotid and vertebral arteries are patent within the neck without appreciable stenosis or evidence of dissection. CTA head: 1. No intracranial large vessel occlusion or proximal high-grade arterial stenosis. 2. No intracranial aneurysm is identified. Electronically Signed   By: Jackey Loge D.O.   On: 08/23/2021 12:23    Procedures Procedures   Medications Ordered in ED Medications  sodium chloride 0.9 % bolus 1,000 mL (1,000 mLs Intravenous New Bag/Given 08/23/21 1057)  prochlorperazine (COMPAZINE) injection 10 mg (10 mg Intravenous Given 08/23/21 1059)  diphenhydrAMINE (BENADRYL) injection 12.5 mg (12.5 mg Intravenous Given 08/23/21 1057)  iohexol (OMNIPAQUE) 350 MG/ML injection 100 mL (75 mLs Intravenous Contrast Given 08/23/21 1156)    ED Course  I have reviewed the triage vital signs and the nursing notes.  Patient also evaluated by Dr. Dalene Seltzer. Pertinent labs & imaging results that were available during my care of the patient were reviewed by me and considered in my medical decision making (see chart for details).    MDM Rules/Calculators/A&P Jaquita Zhane Donlan is a 35 y.o. female with a past medical history of rheumatoid arthritis and frequent headaches presenting today with a complaint of a sudden onset headache that began last night.  Patient reports that she was standing when she felt a sudden onset  headache in the middle of her head.  This headache was worsened with movement such as flexion and extension of the neck.  She was concerned because this is not like her typical headaches.  For the past few months she has been getting multiple headaches a day.  Mother with migraine disorder however she has never been diagnosed with it.  Does not have photo or phonophobia at this time and usual headaches do not present with the symptoms either.  This morning she became nauseous, never vomiting.  Denies visual disturbances.  Last night experienced some left arm numbness however that has since resolved.  Says she feels as though her speech is jumbled.  No falls.  Has been treating  her pain with ibuprofen and Tylenol and this has not helped.  Usually her headaches go away when she stays in a dark room with a cold washrag however this did not help and she continued with a headache this morning.  Currently rates it 7/10.   Emergent considerations for headache include subarachnoid hemorrhage, meningitis, temporal arteritis, glaucoma, cerebral ischemia, carotid/vertebral dissection, intracranial tumor, Venous sinus thrombosis, carbon monoxide poisoning, acute or chronic subdural hemorrhage. All of these were considered throughout the evaluation of this patient.  Patient was evaluated by me at bedside.  She did not appear to be in acute distress.  Neurological exam was normal.  Because the patient describes this headache as sudden onset and more severe than her usual I obtained imaging.  CT without contrast of her head was normal.  CT angio without aneurysm or bleeding.  When I reevaluated the patient she reported pain was gone after treatment with Compazine and Benadryl.  Requesting discharge at this time.  Patient ambulatory and stable for discharge with strict return precautions.  We discussed reasons for return and these were also attached to her discharge papers.  We also discussed potential at home remedies for  headaches.  I suspect the patient to be suffering from migraine disorder.  She reports that she has an appointment with her primary care provider in November and she can discuss her chronic headaches in that appointment. Final Clinical Impression(s) / ED Diagnoses Final diagnoses:  Acute nonintractable headache, unspecified headache type    Rx / DC Orders Results and diagnoses were explained to the patient. Return precautions discussed in full. Patient had no additional questions and expressed complete understanding.     Woodroe Chen 08/23/21 1240    Alvira Monday, MD 08/24/21 [phone]

## 2021-08-27 ENCOUNTER — Encounter: Payer: Self-pay | Admitting: Family Medicine

## 2021-08-27 ENCOUNTER — Ambulatory Visit (INDEPENDENT_AMBULATORY_CARE_PROVIDER_SITE_OTHER): Payer: BC Managed Care – PPO | Admitting: Family Medicine

## 2021-08-27 ENCOUNTER — Other Ambulatory Visit: Payer: Self-pay

## 2021-08-27 VITALS — Ht 62.0 in | Wt 155.0 lb

## 2021-08-27 DIAGNOSIS — S93492D Sprain of other ligament of left ankle, subsequent encounter: Secondary | ICD-10-CM

## 2021-08-27 NOTE — Patient Instructions (Signed)
Good to see you Please use ice as needed  Please try physical therapy   Please send me a message in MyChart with any questions or updates.  Please see me back as needed.   --Dr. Jordan Likes

## 2021-08-27 NOTE — Progress Notes (Signed)
  Karem Farha - 35 y.o. female MRN [id number]  Date of birth: 1985-12-07  SUBJECTIVE:  Including CC & ROS.  No chief complaint on file.   Colleen [name] is a 35 y.o. female that is following up for her ankle pain.  She is doing well and is starting to get back to exercise.  She has been using the Aircast.   Review of Systems See HPI   HISTORY: Past Medical, Surgical, Social, and Family History Reviewed & Updated per EMR.   Pertinent Historical Findings include:  Past Medical History:  Diagnosis Date   Arthritis     Past Surgical History:  Procedure Laterality Date   CESAREAN SECTION N/A 01/10/2020   Procedure: CESAREAN SECTION;  Surgeon: Huel Cote, MD;  Location: [facility] LD ORS;  Service: Obstetrics;  Laterality: N/A;   NO PAST SURGERIES      History reviewed. No pertinent family history.  Social History   Socioeconomic History   Marital status: Married    Spouse name: Not on file   Number of children: Not on file   Years of education: Not on file   Highest education level: Not on file  Occupational History   Not on file  Tobacco Use   Smoking status: Never   Smokeless tobacco: Never  Vaping Use   Vaping Use: Never used  Substance and Sexual Activity   Alcohol use: Never   Drug use: Never   Sexual activity: Yes  Other Topics Concern   Not on file  Social History Narrative   Not on file   Social Determinants of Health   Financial Resource Strain: Not on file  Food Insecurity: Not on file  Transportation Needs: Not on file  Physical Activity: Not on file  Stress: Not on file  Social Connections: Not on file  Intimate Partner Violence: Not on file     PHYSICAL EXAM:  VS: Ht 5\' 2"  (1.575 m)   Wt 155 lb (70.3 kg)   BMI 28.35 kg/m  Physical Exam Gen: NAD, alert, cooperative with exam, well-appearing      ASSESSMENT & PLAN:   Sprain of anterior talofibular ligament of left ankle Doing well with little to no pain. -Counseled on  home exercise therapy and supportive care. -Counseled on discontinuing the Aircast. -Referral to physical therapy.

## 2021-08-27 NOTE — Assessment & Plan Note (Signed)
Doing well with little to no pain. -Counseled on home exercise therapy and supportive care. -Counseled on discontinuing the Aircast. -Referral to physical therapy.

## 2021-09-03 ENCOUNTER — Other Ambulatory Visit: Payer: Self-pay

## 2021-09-03 ENCOUNTER — Encounter: Payer: Self-pay | Admitting: Physical Therapy

## 2021-09-03 ENCOUNTER — Ambulatory Visit: Payer: BC Managed Care – PPO | Attending: Family Medicine | Admitting: Physical Therapy

## 2021-09-03 DIAGNOSIS — M25572 Pain in left ankle and joints of left foot: Secondary | ICD-10-CM | POA: Insufficient documentation

## 2021-09-03 DIAGNOSIS — R2681 Unsteadiness on feet: Secondary | ICD-10-CM | POA: Diagnosis not present

## 2021-09-03 DIAGNOSIS — M6281 Muscle weakness (generalized): Secondary | ICD-10-CM | POA: Insufficient documentation

## 2021-09-03 NOTE — Therapy (Signed)
[facility] [address] [place], [place], [address] Phone: [phone]   Fax:  [phone]  Physical Therapy Evaluation  Patient Details  Name: Colleen [name] MRN: [id number] Date of Birth: 1986/11/24 No data recorded  Encounter Date: 09/03/2021   PT End of Session - 09/03/21 1026     Visit Number 1    Number of Visits 9    Date for PT Re-Evaluation 10/15/21    PT Start Time 0926    PT Stop Time 1011    PT Time Calculation (min) 45 min    Activity Tolerance Patient tolerated treatment well    Behavior During Therapy [facility] for tasks assessed/performed             Past Medical History:  Diagnosis Date   Arthritis     Past Surgical History:  Procedure Laterality Date   CESAREAN SECTION N/A 01/10/2020   Procedure: CESAREAN SECTION;  Surgeon: Huel Cote, MD;  Location: [facility] LD ORS;  Service: Obstetrics;  Laterality: N/A;   NO PAST SURGERIES      There were no vitals filed for this visit.    Subjective Assessment - 09/03/21 0929     Subjective Patient reports fall withl running 8/22, suffering a sprain TF ligament on L. She was in a CAM boot followed by an aircast for several weeks. Has begun strength and stretching exercises using theraband    Pertinent History arthritis    Limitations Walking;Other (comment)    Patient Stated Goals Return to running, Zoomba, lifitng.    Currently in Pain? Yes    Pain Score 3     Pain Location Ankle    Pain Orientation Left    Pain Descriptors / Indicators Aching    Pain Type Chronic pain    Pain Onset More than a month ago    Pain Frequency Intermittent    Aggravating Factors  running, standing activities    Pain Relieving Factors rest, ice    Effect of Pain on Daily Activities Limits all athletic activity.                [facility] PT Assessment - 09/03/21 0001       Balance Screen   Has the patient fallen in the past 6 months Yes   While running, uneven  pavement   How many times? 1    Has the patient had a decrease in activity level because of a fear of falling?  No    Is the patient reluctant to leave their home because of a fear of falling?  No      Home [employer]ist information centre manager residence    Living Arrangements Spouse/significant other;Children      Prior Function   Level of Independence Independent    Vocation Part time employment    Vocation Requirements desk work, walking    Leisure running, [employer], [employer] training      ROM / Strength   AROM / PROM / Strength AROM;Strength      AROM   Overall AROM Comments R ankle WNL strength and ROM, L measured against R    AROM Assessment Site Ankle    Right/Left Ankle Left    Left Ankle Dorsiflexion 30   Percent limited   Left Ankle Plantar Flexion 30    Left Ankle Inversion 50    Left Ankle Eversion 50      Strength   Strength Assessment Site Ankle;Knee;Hip  Right/Left Hip Left   R WNL   Left Hip Flexion 4+/5    Left Hip ABduction 4-/5    Right/Left Knee Left    Left Knee Flexion 4+/5    Left Knee Extension 4+/5    Right/Left Ankle Left    Left Ankle Dorsiflexion 3+/5    Left Ankle Plantar Flexion 3+/5    Left Ankle Inversion 2+/5    Left Ankle Eversion 2+/5      Flexibility   Soft Tissue Assessment /Muscle Length yes      Special Tests    Special Tests Hip Special Tests      Ambulation/Gait   Gait Comments Patient demosntrates antalgic gait during turns and changing direction during walking.She attempted to run at home with increaesd pain noted.                        Objective measurements completed on examination: See above findings.                PT Education - 09/03/21 1022     Education Details Patient educated to HEP, POC and progression.    Person(s) Educated Patient    Methods Explanation;Demonstration;Verbal cues;Handout    Comprehension Returned demonstration;Verbalized understanding              PT  Short Term Goals - 09/03/21 1035       PT SHORT TERM GOAL #1   Title I with initial HEP    Baseline Provided written instructions.    Time 2    Period Weeks    Target Date 09/17/21               PT Long Term Goals - 09/03/21 1036       PT LONG TERM GOAL #1   Title Patient will demosntrate L ankle ROM WFL and painfree in all planes of movement    Baseline limited ROM/painful    Time 6    Period Weeks    Target Date 10/15/21      PT LONG TERM GOAL #2   Title Patient will improve L ankle strenth in all planes of movement to at least 4/5    Baseline 2+/5    Time 6    Target Date 10/15/21      PT LONG TERM GOAL #3   Title Patient will be able to maintain static and dynamic balance during single limb stance on soft surface x at least 15 seconds.    Baseline static standing on one leg x 4 seconds on firm surface.    Time 6    Period Weeks    Status New    Target Date 10/15/21      PT LONG TERM GOAL #4   Title Patient will return to running at least 3 miles (using run/walk) with pain no > 4/10.    Baseline Unable to run    Time 6    Period Weeks    Target Date 10/15/21                    Plan - 09/03/21 1027     Clinical Impression Statement 35 YO S/P fall while running on 07/20/21. She sustained a L TFL ankle sprain, has progressed through recover and arrives for therapy. She demonstrates weakness, decreased ROM in all planes of L ankle, especially inversion/eversion. She also demosntrates mild L hip abductor weakness.    Personal Factors and Comorbidities Past/Current Experience  Examination-Activity Limitations Other;Stand;Locomotion Level    Examination-Participation Restrictions Yard Work;Other;Cleaning;Community Activity    Stability/Clinical Decision Making Evolving/Moderate complexity    Clinical Decision Making Low    Rehab Potential Excellent    PT Frequency 1x / week    PT Duration 6 weeks    PT Treatment/Interventions ADLs/Self Care Home  Management;Iontophoresis 4mg /ml Dexamethasone;Gait training;Stair training;Neuromuscular re-education;Balance training;Therapeutic exercise;Therapeutic activities;Functional mobility training;Patient/family education;Manual techniques    PT Next Visit Plan Update HEP, attempt elliptical, progress strenght and ROM of ankle    PT Home Exercise Plan ankle ROM, stand on 1 leg, hip abduction in stand iwth yellow T band             Patient will benefit from skilled therapeutic intervention in order to improve the following deficits and impairments:  Difficulty walking, Decreased endurance, Decreased activity tolerance, Pain, Decreased balance, Decreased strength, Increased edema  Visit Diagnosis: Muscle weakness (generalized)  Unsteadiness on feet  Pain in left ankle and joints of left foot     Problem List Patient Active Problem List   Diagnosis Date Noted   Sprain of anterior talofibular ligament of left ankle 08/06/2021   S/P primary low transverse C-section 01/10/2020   Pregnancy 01/09/2020   Traumatic injury during pregnancy in third trimester 12/28/2019    12/30/2019, DPT 09/03/2021, 10:45 AM  [facility] [address] [place], [place], [place] Phone: [phone]   Fax:  [phone]  Name: Lethia Donlon MRN: Ed Blalock Date of Birth: 29-Jun-1986

## 2021-09-03 NOTE — Patient Instructions (Signed)
Access Code: Noel Christmas URL: [email or web address] Date: 09/03/2021 Prepared by: Oley Balm  Exercises Ankle Inversion Eversion Towel Slide - 1 x daily - 7 x weekly - 1 sets - 10 reps Seated Heel Toe Raises - 1 x daily - 7 x weekly - 1 sets - 10 reps Seated Heel Raise - 1 x daily - 7 x weekly - 1 sets - 10 reps Seated Ankle Circles - 1 x daily - 7 x weekly - 1 sets - 10 reps Hip Abduction with Resistance Loop - 1 x daily - 7 x weekly - 1 sets - 10 reps

## 2021-09-09 ENCOUNTER — Ambulatory Visit: Payer: BC Managed Care – PPO | Admitting: Physical Therapy

## 2021-09-09 ENCOUNTER — Other Ambulatory Visit: Payer: Self-pay

## 2021-09-09 DIAGNOSIS — M25572 Pain in left ankle and joints of left foot: Secondary | ICD-10-CM | POA: Diagnosis not present

## 2021-09-09 DIAGNOSIS — M6281 Muscle weakness (generalized): Secondary | ICD-10-CM | POA: Diagnosis not present

## 2021-09-09 DIAGNOSIS — R2681 Unsteadiness on feet: Secondary | ICD-10-CM | POA: Diagnosis not present

## 2021-09-09 NOTE — Therapy (Signed)
[facility] [address] [place], [place], [address] Phone: [phone]   Fax:  [phone]  Physical Therapy Treatment  Patient Details  Name: Colleen [name] MRN: [id number] Date of Birth: 1986/05/24 No data recorded  Encounter Date: 09/09/2021   PT End of Session - 09/09/21 1802     Visit Number 2    Number of Visits 9    Date for PT Re-Evaluation 10/15/21    PT Start Time 1530    PT Stop Time 1615    PT Time Calculation (min) 45 min    Activity Tolerance Patient tolerated treatment well    Behavior During Therapy [facility] for tasks assessed/performed             Past Medical History:  Diagnosis Date   Arthritis     Past Surgical History:  Procedure Laterality Date   CESAREAN SECTION N/A 01/10/2020   Procedure: CESAREAN SECTION;  Surgeon: Huel Cote, MD;  Location: [facility] LD [facility]S;  Service: Obstetrics;  Laterality: N/A;   NO PAST SURGERIES      There were no vitals filed for this visit.   Subjective Assessment - 09/09/21 1529     Subjective Nothing new or different. Usually ankle is generally sore. Pt has been performing her HEP pretty much daily.    Pertinent History arthritis    Limitations Walking;Other (comment)    Patient Stated Goals Return to running, Zoomba, lifitng.    Currently in Pain? No/denies    Pain Onset More than a month ago                [facility] PT Assessment - 09/09/21 0001       AROM   Left Ankle Dorsiflexion -10                           [facility] Adult PT Treatment/Exercise - 09/09/21 0001       Exercises   Exercises Ankle      Manual Therapy   Manual Therapy Joint mobilization;Passive ROM    Joint Mobilization Talocrural distraction grade III; subtalar grade II to III mobs for DF; tib and fib PA mobs grade II for DF    Passive ROM Ankle DF 3x30 sec      Ankle Exercises: Stretches   Soleus Stretch 2 reps;30 seconds    Gastroc Stretch 30 seconds;2  reps    Other Stretch Self mobilization for ankle DF with foot on step 10x 5 sec      Ankle Exercises: Aerobic   Elliptical --   Attempted x 2 min; however, pt limited due to decreased ankle ROM     Ankle Exercises: Standing   Rocker Board --   3x10   Heel Raises Right;Both;10 reps    Other Standing Ankle Exercises tandem stance L foot in back 3x20 sec    Other Standing Ankle Exercises toe raises 2x10      Ankle Exercises: Seated   Towel Inversion/Eversion 2 reps   10 reps                    PT Education - 09/09/21 1622     Education Details Discussed pt's joint stiffness and provided home joint mobilization. Updated HEP.    Person(s) Educated Patient    Methods Explanation;Demonstration;Verbal cues;Handout    Comprehension Verbalized understanding;Returned demonstration;Verbal cues required;Tactile cues required  PT Short Term Goals - 09/03/21 1035       PT SHORT TERM GOAL #1   Title I with initial HEP    Baseline Provided written instructions.    Time 2    Period Weeks    Target Date 09/17/21               PT Long Term Goals - 09/03/21 1036       PT LONG TERM GOAL #1   Title Patient will demosntrate L ankle ROM WFL and painfree in all planes of movement    Baseline limited ROM/painful    Time 6    Period Weeks    Target Date 10/15/21      PT LONG TERM GOAL #2   Title Patient will improve L ankle strenth in all planes of movement to at least 4/5    Baseline 2+/5    Time 6    Target Date 10/15/21      PT LONG TERM GOAL #3   Title Patient will be able to maintain static and dynamic balance during single limb stance on soft surface x at least 15 seconds.    Baseline static standing on one leg x 4 seconds on firm surface.    Time 6    Period Weeks    Status New    Target Date 10/15/21      PT LONG TERM GOAL #4   Title Patient will return to running at least 3 miles (using run/walk) with pain no > 4/10.    Baseline Unable to  run    Time 6    Period Weeks    Target Date 10/15/21                   Plan - 09/09/21 1622     Clinical Impression Statement Treatment session focused on improving ankle AROM, strength, and stability. Trialed elliptical; however, pt unable to comfortably maintain her heel down to take strides. Pt with very stiff talocrural and subtalar joint and complains of posterior lateral malleolus pain when performing increased ankle DF -- provided joint mobilization to address this with some improvements.    Personal Factors and Comorbidities Past/Current Experience    Examination-Activity Limitations Other;Stand;Locomotion Level    Examination-Participation Restrictions Yard Work;Other;Cleaning;Community Activity    Stability/Clinical Decision Making Evolving/Moderate complexity    Rehab Potential Excellent    PT Frequency 1x / week    PT Duration 6 weeks    PT Treatment/Interventions ADLs/Self Care Home Management;Iontophoresis 4mg /ml Dexamethasone;Gait training;Stair training;Neuromuscular re-education;Balance training;Therapeutic exercise;Therapeutic activities;Functional mobility training;Patient/family education;Manual techniques    PT Next Visit Plan Update HEP, continue manual therapy/joint mobs as indicated; progress strength and ROM of ankle    PT Home Exercise Plan Access Code: [email or web address]    Consulted and Agree with Plan of Care Patient             Patient will benefit from skilled therapeutic intervention in order to improve the following deficits and impairments:  Difficulty walking, Decreased endurance, Decreased activity tolerance, Pain, Decreased balance, Decreased strength, Increased edema  Visit Diagnosis: Muscle weakness (generalized)  Unsteadiness on feet  Pain in left ankle and joints of left foot     Problem List Patient Active Problem List   Diagnosis Date Noted   Sprain of anterior talofibular ligament of left ankle 08/06/2021   S/P primary low  transverse C-section 01/10/2020   Pregnancy 01/09/2020   Traumatic injury during pregnancy in third trimester 12/28/2019  Amunique Neyra April Dell Ponto, PT, DPT 09/09/2021, 6:08 PM  [facility] [address] [place], [place], [address] Phone: [phone]   Fax:  [phone]  Name: Taraann Olthoff MRN: [id number] Date of Birth: June 13, 1986

## 2021-09-09 NOTE — Patient Instructions (Signed)
Access Code: Noel Christmas URL: [email or web address] Date: 09/09/2021 Prepared by: Vernon Prey April Kirstie Peri  Exercises Ankle Inversion Eversion Towel Slide - 1 x daily - 7 x weekly - 1 sets - 10 reps Seated Ankle Circles - 1 x daily - 7 x weekly - 1 sets - 10 reps Hip Abduction with Resistance Loop - 1 x daily - 7 x weekly - 1 sets - 10 reps Standing Heel Raise with Support - 1 x daily - 7 x weekly - 2 sets - 10 reps Standing Ankle Dorsiflexion Stretch on Chair - 1 x daily - 7 x weekly - 1 sets - 10 reps - 3 sec hold Tandem Stance - 1 x daily - 7 x weekly - 3 sets - 10 reps CLX Ankle Dorsiflexion and Eversion - 1 x daily - 7 x weekly - 2 sets - 10 reps

## 2021-09-16 ENCOUNTER — Encounter: Payer: Self-pay | Admitting: Physical Therapy

## 2021-09-16 ENCOUNTER — Ambulatory Visit: Payer: BC Managed Care – PPO | Admitting: Physical Therapy

## 2021-09-16 ENCOUNTER — Other Ambulatory Visit: Payer: Self-pay

## 2021-09-16 DIAGNOSIS — R2681 Unsteadiness on feet: Secondary | ICD-10-CM | POA: Diagnosis not present

## 2021-09-16 DIAGNOSIS — M25572 Pain in left ankle and joints of left foot: Secondary | ICD-10-CM | POA: Diagnosis not present

## 2021-09-16 DIAGNOSIS — M6281 Muscle weakness (generalized): Secondary | ICD-10-CM | POA: Diagnosis not present

## 2021-09-16 NOTE — Therapy (Signed)
[facility] [address] [place], [place], [address] Phone: [phone]   Fax:  [phone]  Physical Therapy Treatment  Patient Details  Name: Colleen [name] MRN: [id number] Date of Birth: 1986/10/15 No data recorded  Encounter Date: 09/16/2021   PT End of Session - 09/16/21 1639     Visit Number 3    Number of Visits 9    Date for PT Re-Evaluation 10/15/21    PT Start Time 1547    PT Stop Time 1638    PT Time Calculation (min) 51 min    Activity Tolerance Patient tolerated treatment well;Patient limited by pain    Behavior During Therapy [facility] for tasks assessed/performed             Past Medical History:  Diagnosis Date   Arthritis     Past Surgical History:  Procedure Laterality Date   CESAREAN SECTION N/A 01/10/2020   Procedure: CESAREAN SECTION;  Surgeon: Huel Cote, MD;  Location: [facility] LD ORS;  Service: Obstetrics;  Laterality: N/A;   NO PAST SURGERIES      There were no vitals filed for this visit.   Subjective Assessment - 09/16/21 1547     Subjective No issues. HEP going well.    Limitations Walking;Other (comment)    Patient Stated Goals Return to running, Zoomba, lifitng.    Currently in Pain? Yes    Pain Score 4     Pain Location Ankle    Pain Orientation Left    Pain Descriptors / Indicators Aching;Tightness;Tiring    Pain Type Chronic pain    Pain Onset More than a month ago    Pain Frequency Intermittent                               [facility] Adult PT Treatment/Exercise - 09/16/21 0001       Manual Therapy   Manual Therapy Joint mobilization;Passive ROM    Joint Mobilization Talocrural distraction grade III; subtalar grade II to III mobs for DF; tib and fib PA mobs grade II for DF    Passive ROM Ankle DF 3x30 sec      Ankle Exercises: Stretches   Soleus Stretch 2 reps;30 seconds    Gastroc Stretch 3 reps;30 seconds   using strap in sitting     Ankle  Exercises: Aerobic   Nustep 2:30 L5- caused pinching, so deferred      Ankle Exercises: Standing   Rocker Board Other (comment)   2 x 10 for/back and lateral                    PT Education - 09/16/21 1638     Education Details Reviewed HEP to answer questions regarding techniques.    Person(s) Educated Patient    Methods Explanation;Demonstration    Comprehension Verbalized understanding;Returned demonstration              PT Short Term Goals - 09/16/21 1643       PT SHORT TERM GOAL #1   Title I with initial HEP    Baseline Patient requested clarification of some HEP exercises.    Time 1    Period Weeks    Status On-going    Target Date 09/23/21               PT Long Term Goals - 09/03/21 1036       PT LONG  TERM GOAL #1   Title Patient will demosntrate L ankle ROM WFL and painfree in all planes of movement    Baseline limited ROM/painful    Time 6    Period Weeks    Target Date 10/15/21      PT LONG TERM GOAL #2   Title Patient will improve L ankle strenth in all planes of movement to at least 4/5    Baseline 2+/5    Time 6    Target Date 10/15/21      PT LONG TERM GOAL #3   Title Patient will be able to maintain static and dynamic balance during single limb stance on soft surface x at least 15 seconds.    Baseline static standing on one leg x 4 seconds on firm surface.    Time 6    Period Weeks    Status New    Target Date 10/15/21      PT LONG TERM GOAL #4   Title Patient will return to running at least 3 miles (using run/walk) with pain no > 4/10.    Baseline Unable to run    Time 6    Period Weeks    Target Date 10/15/21                   Plan - 09/16/21 1640     Clinical Impression Statement Treatment continues with progressive mobilizations, stretch, and strengthening for R ankle. Attempted Nu-Step, but it caused pinching in ant ankle, so deferred. joint restricitons remain, but she reports slighlty improved movment     Personal Factors and Comorbidities Past/Current Experience    Examination-Activity Limitations Other;Stand;Locomotion Level    Examination-Participation Restrictions Yard Work;Other;Cleaning;Community Activity    Stability/Clinical Decision Making Evolving/Moderate complexity    Clinical Decision Making Low    Rehab Potential Excellent    PT Frequency 1x / week    PT Duration Other (comment)   5w   PT Treatment/Interventions ADLs/Self Care Home Management;Iontophoresis 4mg /ml Dexamethasone;Gait training;Stair training;Neuromuscular re-education;Balance training;Therapeutic exercise;Therapeutic activities;Functional mobility training;Patient/family education;Manual techniques    PT Next Visit Plan Continue with manual therapy and joint mobilizations, possibly utilize modalities to decrease edema in joint. Strength and ROM at ankle.    Consulted and Agree with Plan of Care Patient             Patient will benefit from skilled therapeutic intervention in order to improve the following deficits and impairments:  Difficulty walking, Decreased endurance, Decreased activity tolerance, Pain, Decreased balance, Decreased strength, Increased edema  Visit Diagnosis: Muscle weakness (generalized)  Unsteadiness on feet  Pain in left ankle and joints of left foot     Problem List Patient Active Problem List   Diagnosis Date Noted   Sprain of anterior talofibular ligament of left ankle 08/06/2021   S/P primary low transverse C-section 01/10/2020   Pregnancy 01/09/2020   Traumatic injury during pregnancy in third trimester 12/28/2019    12/30/2019, DPT 09/16/2021, 4:45 PM  [facility] [address] [place], [place], [place] Phone: [phone]   Fax:  [phone]  Name: Colleen [name] MRN: Ed Blalock Date of Birth: February 11, 1986

## 2021-09-23 ENCOUNTER — Ambulatory Visit: Payer: BC Managed Care – PPO | Admitting: Physical Therapy

## 2021-09-23 ENCOUNTER — Other Ambulatory Visit: Payer: Self-pay

## 2021-09-23 DIAGNOSIS — M6281 Muscle weakness (generalized): Secondary | ICD-10-CM | POA: Diagnosis not present

## 2021-09-23 DIAGNOSIS — R2681 Unsteadiness on feet: Secondary | ICD-10-CM | POA: Diagnosis not present

## 2021-09-23 DIAGNOSIS — M25572 Pain in left ankle and joints of left foot: Secondary | ICD-10-CM

## 2021-09-23 NOTE — Patient Instructions (Signed)
Access Code: Noel Christmas URL: [email or web address] Date: 09/23/2021 Prepared by: Vernon Prey April [facility]  Exercises Gastroc Stretch on Wall - 1 x daily - 7 x weekly - 3 sets - 30 sec hold Standing Soleus Stretch - 1 x daily - 7 x weekly - 3 sets - 30 sec hold Standing Ankle Dorsiflexion Stretch on Chair - 1 x daily - 7 x weekly - 2 sets - 10 reps Standing Heel Raise with Support - 1 x daily - 7 x weekly - 3 sets - 10 reps Seated Ankle Inversion with Resistance and Legs Crossed - 1 x daily - 7 x weekly - 3 sets - 10 reps Seated Ankle Eversion with Resistance - 1 x daily - 7 x weekly - 3 sets - 10 reps

## 2021-09-23 NOTE — Therapy (Signed)
[facility] [address] [place], [place], [address] Phone: [phone]   Fax:  [phone]  Physical Therapy Treatment  Patient Details  Name: Colleen [name] MRN: [id number] Date of Birth: 1986-05-04 No data recorded  Encounter Date: 09/23/2021   PT End of Session - 09/23/21 1149     Visit Number 4    Number of Visits 9    Date for PT Re-Evaluation 10/15/21    PT Start Time 0939    PT Stop Time 1020    PT Time Calculation (min) 41 min    Activity Tolerance Patient tolerated treatment well;Patient limited by pain    Behavior During Therapy [facility] for tasks assessed/performed             Past Medical History:  Diagnosis Date   Arthritis     Past Surgical History:  Procedure Laterality Date   CESAREAN SECTION N/A 01/10/2020   Procedure: CESAREAN SECTION;  Surgeon: Huel Cote, MD;  Location: [facility] LD [facility]S;  Service: Obstetrics;  Laterality: N/A;   NO PAST SURGERIES      There were no vitals filed for this visit.   Subjective Assessment - 09/23/21 0951     Subjective No problems.    Limitations Walking;Other (comment)    Patient Stated Goals Return to running, Zoomba, lifitng.    Currently in Pain? No/denies    Pain Onset More than a month ago                               [facility] Adult PT Treatment/Exercise - 09/23/21 0001       Manual Therapy   Joint Mobilization Talocrural distraction grade III; subtalar grade II to III mobs for DF; Calcaneus side glides and PA mobs, tib and fib PA mobs grade II for DF    Passive ROM Ankle DF MWM in standing x10      Ankle Exercises: Standing   Other Standing Ankle Exercises On dome side of bosu: 2x10 each of DF, PF with ball between heels, inv, ev on L      Ankle Exercises: Stretches   Soleus Stretch 30 seconds    Gastroc Stretch 30 seconds      Ankle Exercises: Aerobic   Nustep 4 min L5 LEs only                     PT  Education - 09/23/21 1150     Education Details Discussed keeping 1x/wk appointments due to need for heavy manual therapy to mobilize her joints. Will try to change to every other week when able.    Person(s) Educated Patient    Methods Explanation;Demonstration;Tactile cues;Verbal cues;Handout    Comprehension Verbalized understanding;Returned demonstration;Verbal cues required;Tactile cues required              PT Short Term Goals - 09/16/21 1643       PT SHORT TERM GOAL #1   Title I with initial HEP    Baseline Patient requested clarification of some HEP exercises.    Time 1    Period Weeks    Status On-going    Target Date 09/23/21               PT Long Term Goals - 09/03/21 1036       PT LONG TERM GOAL #1   Title Patient will demosntrate L ankle ROM WFL and painfree in  all planes of movement    Baseline limited ROM/painful    Time 6    Period Weeks    Target Date 10/15/21      PT LONG TERM GOAL #2   Title Patient will improve L ankle strenth in all planes of movement to at least 4/5    Baseline 2+/5    Time 6    Target Date 10/15/21      PT LONG TERM GOAL #3   Title Patient will be able to maintain static and dynamic balance during single limb stance on soft surface x at least 15 seconds.    Baseline static standing on one leg x 4 seconds on firm surface.    Time 6    Period Weeks    Status New    Target Date 10/15/21      PT LONG TERM GOAL #4   Title Patient will return to running at least 3 miles (using run/walk) with pain no > 4/10.    Baseline Unable to run    Time 6    Period Weeks    Target Date 10/15/21                   Plan - 09/23/21 1007     Clinical Impression Statement Treatment focused on continuing to improve ankle ROM with joint mobs and stretching. Worked on standing stability on bosu ball and performing ankle exercises in this position. Difficulty with inversion -- Talocrural joint is loosening; subtalar joint appears  to be most restricted. Pt able to better tolerate Nu-Step at end of session with less pinching.    Personal Factors and Comorbidities Past/Current Experience    Examination-Activity Limitations Other;Stand;Locomotion Level    Examination-Participation Restrictions Yard Work;Other;Cleaning;Community Activity    Stability/Clinical Decision Making Evolving/Moderate complexity    Rehab Potential Excellent    PT Frequency 1x / week    PT Duration Other (comment)   5w   PT Treatment/Interventions ADLs/Self Care Home Management;Iontophoresis 4mg /ml Dexamethasone;Gait training;Stair training;Neuromuscular re-education;Balance training;Therapeutic exercise;Therapeutic activities;Functional mobility training;Patient/family education;Manual techniques    PT Next Visit Plan Continue with manual therapy and joint mobilizations (focus on subtalar joint), possibly utilize modalities to decrease edema in joint. Strength and ROM at ankle.    PT Home Exercise Plan Access Code: [email or web address]    Consulted and Agree with Plan of Care Patient             Patient will benefit from skilled therapeutic intervention in order to improve the following deficits and impairments:  Difficulty walking, Decreased endurance, Decreased activity tolerance, Pain, Decreased balance, Decreased strength, Increased edema  Visit Diagnosis: Muscle weakness (generalized)  Unsteadiness on feet  Pain in left ankle and joints of left foot     Problem List Patient Active Problem List   Diagnosis Date Noted   Sprain of anterior talofibular ligament of left ankle 08/06/2021   S/P primary low transverse C-section 01/10/2020   Pregnancy 01/09/2020   Traumatic injury during pregnancy in third trimester 12/28/2019    [facility] April May, PT, DPT 09/23/2021, 11:57 AM  [facility] [place] [address] [place], [place], [place] Phone: [phone]   Fax:  [phone]  Name: Colleen [name] MRN: Ed Blalock Date of Birth: 07-13-1986

## 2021-09-29 ENCOUNTER — Other Ambulatory Visit: Payer: Self-pay

## 2021-09-30 ENCOUNTER — Ambulatory Visit (INDEPENDENT_AMBULATORY_CARE_PROVIDER_SITE_OTHER): Payer: BC Managed Care – PPO | Admitting: Family Medicine

## 2021-09-30 ENCOUNTER — Ambulatory Visit: Payer: BC Managed Care – PPO | Attending: Family Medicine | Admitting: Physical Therapy

## 2021-09-30 ENCOUNTER — Encounter: Payer: Self-pay | Admitting: Family Medicine

## 2021-09-30 VITALS — BP 102/62 | HR 70 | Temp 98.0°F | Ht 63.0 in | Wt 166.5 lb

## 2021-09-30 DIAGNOSIS — M6281 Muscle weakness (generalized): Secondary | ICD-10-CM | POA: Diagnosis not present

## 2021-09-30 DIAGNOSIS — M25572 Pain in left ankle and joints of left foot: Secondary | ICD-10-CM | POA: Insufficient documentation

## 2021-09-30 DIAGNOSIS — G43009 Migraine without aura, not intractable, without status migrainosus: Secondary | ICD-10-CM

## 2021-09-30 DIAGNOSIS — Z1159 Encounter for screening for other viral diseases: Secondary | ICD-10-CM

## 2021-09-30 DIAGNOSIS — M0609 Rheumatoid arthritis without rheumatoid factor, multiple sites: Secondary | ICD-10-CM

## 2021-09-30 DIAGNOSIS — R2681 Unsteadiness on feet: Secondary | ICD-10-CM | POA: Diagnosis not present

## 2021-09-30 DIAGNOSIS — Z23 Encounter for immunization: Secondary | ICD-10-CM | POA: Diagnosis not present

## 2021-09-30 DIAGNOSIS — Z0000 Encounter for general adult medical examination without abnormal findings: Secondary | ICD-10-CM

## 2021-09-30 LAB — COMPREHENSIVE METABOLIC PANEL
ALT: 27 U/L (ref 0–35)
AST: 16 U/L (ref 0–37)
Albumin: 4.3 g/dL (ref 3.5–5.2)
Alkaline Phosphatase: 68 U/L (ref 39–117)
BUN: 13 mg/dL (ref 6–23)
CO2: 28 mEq/L (ref 19–32)
Calcium: 9.2 mg/dL (ref 8.4–10.5)
Chloride: 104 mEq/L (ref 96–112)
Creatinine, Ser: 0.76 mg/dL (ref 0.40–1.20)
GFR: 102.68 mL/min (ref >60.00)
Glucose, Bld: 93 mg/dL (ref 70–99)
Potassium: 4.2 mEq/L (ref 3.5–5.1)
Sodium: 137 mEq/L (ref 135–145)
Total Bilirubin: 0.4 mg/dL (ref 0.2–1.2)
Total Protein: 6.6 g/dL (ref 6.0–8.3)

## 2021-09-30 LAB — CBC
HCT: 37 % (ref 36.0–46.0)
Hemoglobin: 12.3 g/dL (ref 12.0–15.0)
MCHC: 33.2 g/dL (ref 30.0–36.0)
MCV: 90.6 fl (ref 78.0–100.0)
Platelets: 253 10*3/uL (ref 150.0–400.0)
RBC: 4.08 Mil/uL (ref 3.87–5.11)
RDW: 12.2 % (ref 11.5–15.5)
WBC: 5.3 10*3/uL (ref 4.0–10.5)

## 2021-09-30 LAB — LIPID PANEL
Cholesterol: 170 mg/dL (ref 0–200)
HDL: 49.1 mg/dL (ref >39.00)
LDL Cholesterol: 97 mg/dL (ref 0–99)
NonHDL: 121.07
Total CHOL/HDL Ratio: 3
Triglycerides: 118 mg/dL (ref 0.0–149.0)
VLDL: 23.6 mg/dL (ref 0.0–40.0)

## 2021-09-30 LAB — HEPATITIS C ANTIBODY
Hepatitis C Ab: NONREACTIVE
SIGNAL TO CUT-OFF: 0.04 (ref <1.00)

## 2021-09-30 MED ORDER — SUMATRIPTAN SUCCINATE 100 MG PO TABS: 100.0000 mg | ORAL_TABLET | ORAL | 2 refills | Status: DC | PRN

## 2021-09-30 MED ORDER — ONDANSETRON 4 MG PO TBDP: 4.0000 mg | ORAL_TABLET | Freq: Three times a day (TID) | ORAL | 0 refills | Status: DC | PRN

## 2021-09-30 NOTE — Progress Notes (Signed)
Chief Complaint  Patient presents with   New Patient (Initial Visit)    Needs physical headaches     Well Woman Colleen [name] is here for a complete physical.   Her last physical was >1 year ago.  Current diet: in general, diet is fair. Current exercise: runner, Zumba, stretching. Fatigue out of ordinary? No Seatbelt? Yes  Health Maintenance Pap/HPV- Yes Tetanus- Yes HIV screening- Yes Hep C screening- No  Headaches: Over past 6-7 mo, has had more freq headaches. Gets 4-5 days per month. Went to ED on 9/25. CT and CTA scans neg. Had a pounding headache that had lasted from the night before. Came on suddenly. She has assoc light sensitivity and nausea. Usually pain is L or R center varying in location. Stress seems to be a trigger. Sometimes can take Excedrin and improve. No aura with her headaches.   Past Medical History:  Diagnosis Date   Arthritis      Past Surgical History:  Procedure Laterality Date   CESAREAN SECTION N/A 01/10/2020   Procedure: CESAREAN SECTION;  Surgeon: Huel Cote, MD;  Location: [facility] LD ORS;  Service: Obstetrics;  Laterality: N/A;   DILATION AND CURETTAGE OF UTERUS      Medications  Current Outpatient Medications on File Prior to Visit  Medication Sig Dispense Refill   hydroxychloroquine (PLAQUENIL) 200 MG tablet Take 300 mg by mouth daily.      Allergies No Known Allergies  Review of Systems: Constitutional:  no unexpected weight changes Eye:  no recent significant change in vision Ear/Nose/Mouth/Throat:  Ears:  no tinnitus or vertigo and no recent change in hearing Nose/Mouth/Throat:  no complaints of nasal congestion, no sore throat Cardiovascular: no chest pain Respiratory:  no cough and no shortness of breath Gastrointestinal:  no abdominal pain, no change in bowel habits GU:  Female: negative for dysuria or pelvic pain Musculoskeletal/Extremities:  no pain of the joints Integumentary (Skin/Breast):  no abnormal skin  lesions reported Neurologic:  no current headaches Endocrine:  denies fatigue Hematologic/Lymphatic:  No areas of easy bleeding  Exam BP 102/62   Pulse 70   Temp 98 F (36.7 C) (Oral)   Ht 5\' 3"  (1.6 m)   Wt 166 lb 8 oz (75.5 kg)   SpO2 96%   BMI 29.49 kg/m  General:  well developed, well nourished, in no apparent distress Skin:  no significant moles, warts, or growths Head:  no masses, lesions, or tenderness Eyes:  pupils equal and round, sclera anicteric without injection Ears:  canals without lesions, TMs shiny without retraction, no obvious effusion, no erythema Nose:  nares patent, septum midline, mucosa normal, and no drainage or sinus tenderness Throat/Pharynx:  lips and gingiva without lesion; tongue and uvula midline; non-inflamed pharynx; no exudates or postnasal drainage Neck: neck supple without adenopathy, thyromegaly, or masses Lungs:  clear to auscultation, breath sounds equal bilaterally, no respiratory distress Cardio:  regular rate and rhythm, no bruits, no LE edema Abdomen:  abdomen soft, nontender; bowel sounds normal; no masses or organomegaly Genital: Defer to GYN Musculoskeletal:  symmetrical muscle groups noted without atrophy or deformity Extremities:  no clubbing, cyanosis, or edema, no deformities, no skin discoloration Neuro:  gait normal; deep tendon reflexes normal and symmetric Psych: well oriented with normal range of affect and appropriate judgment/insight  Assessment and Plan  Well adult exam - Plan: CBC, Comprehensive metabolic panel, Lipid panel  Rheumatoid arthritis of multiple sites with negative rheumatoid factor (HCC) - Plan: Ambulatory referral to  Rheumatology  Encounter for hepatitis C screening test for low risk patient - Plan: Hepatitis C antibody  Migraine without aura and without status migrainosus, not intractable - Plan: SUMAtriptan (IMITREX) 100 MG tablet  Need for vaccination against Streptococcus pneumoniae - Plan:  Pneumococcal conjugate vaccine 20-valent (Prevnar 20)   Well 35 y.o. female. Counseled on diet and exercise. Other orders as above. PCV 20 given history of rheumatoid arthritis.  Will refer to a [place] doctor at her request to manage her RA.  She plans on getting her flu shot and COVID booster at the same time later this month. Migraines: Chronic, uncontrolled.  Okay to continue Tylenol and Excedrin as needed though we will prescribe Imitrex for abortive therapy.  She is not having frequent enough headaches to consider prophylaxis at this time.  I will see her in 6 weeks to follow-up. The patient voiced understanding and agreement to the plan.  Colleen Roche Ames, DO 09/30/21 12:03 PM

## 2021-09-30 NOTE — Patient Instructions (Addendum)
[email or web address]   Access Code: [email or web address] URL: [email or web address] Date: 09/30/2021 Prepared by: Vernon Prey April Kirstie Peri  Exercises Standing Soleus Stretch - 1 x daily - 7 x weekly - 3 sets - 30 sec hold Standing Ankle Dorsiflexion Stretch on Chair - 1 x daily - 7 x weekly - 2 sets - 10 reps Seated Ankle Inversion with Resistance and Legs Crossed - 1 x daily - 7 x weekly - 3 sets - 10 reps Seated Ankle Eversion with Resistance - 1 x daily - 7 x weekly - 3 sets - 10 reps Heel Raises with Counter Support - 1 x daily - 7 x weekly - 2 sets - 10 reps Single Leg Heel Raise on Step - 1 x daily - 7 x weekly - 2 sets - 10 reps Lateral Hopping on Level Ground - 1 x daily - 7 x weekly - 2 sets - 10 reps

## 2021-09-30 NOTE — Patient Instructions (Signed)
Give us 2-3 business days to get the results of your labs back.   Keep the diet clean and stay active.  Let us know if you need anything. 

## 2021-09-30 NOTE — Therapy (Signed)
[facility] [place] [address] [place], [place], [address] Phone: [phone]   Fax:  [phone]  Physical Therapy Treatment  Patient Details  Name: Colleen [name] MRN: [id number] Date of Birth: 1985-12-21 No data recorded  Encounter Date: 09/30/2021   PT End of Session - 09/30/21 1529     Visit Number 5    Number of Visits 9    Date for PT Re-Evaluation 10/15/21    PT Start Time 1529    PT Stop Time 1615    PT Time Calculation (min) 46 min    Activity Tolerance Patient tolerated treatment well;Patient limited by pain    Behavior During Therapy [facility] for tasks assessed/performed             Past Medical History:  Diagnosis Date   Arthritis     Past Surgical History:  Procedure Laterality Date   CESAREAN SECTION N/A 01/10/2020   Procedure: CESAREAN SECTION;  Surgeon: Huel Cote, MD;  Location: [facility] LD ORS;  Service: Obstetrics;  Laterality: N/A;   DILATION AND CURETTAGE OF UTERUS      There were no vitals filed for this visit.   Subjective Assessment - 09/30/21 1531     Subjective Pt reports no issues. She notes she can get through most of her day now without thinking too much about her ankle. She can feel it every once in a while with particular movements (ex. sitting cross legged) and if she has to walk increased distances (i.e. she walked around the zoo)    Pertinent History arthritis    Limitations Walking;Other (comment)    Patient Stated Goals Return to running, Zoomba, lifitng.    Currently in Pain? No/denies    Pain Onset More than a month ago                               [facility] Adult PT Treatment/Exercise - 09/30/21 0001       Self-Care   Self-Care Other Self-Care Comments    Other Self-Care Comments  Discussed return to run protocol and transition to more strengthening and plyometrics in her HEP      Ankle Exercises: Aerobic   Nustep 4 min L6 LEs/UEs      Ankle  Exercises: Standing   Heel Raises Left;20 reps;Right   single leg heel off step     Ankle Exercises: Seated   Other Seated Ankle Exercises Eversion green tband 2x10; inversion green tband 2x10      Ankle Exercises: Plyometrics   Plyometric Exercises DL hop in place 0H68   on trampoline   Plyometric Exercises DL hop forward/backward 0S81; DL hop side to side 1S31      Ankle Exercises: Stretches   Gastroc Stretch 30 seconds                       PT Short Term Goals - 09/16/21 1643       PT SHORT TERM GOAL #1   Title I with initial HEP    Baseline Patient requested clarification of some HEP exercises.    Time 1    Period Weeks    Status On-going    Target Date 09/23/21               PT Long Term Goals - 09/03/21 1036       PT LONG TERM GOAL #1   Title Patient  will demosntrate L ankle ROM WFL and painfree in all planes of movement    Baseline limited ROM/painful    Time 6    Period Weeks    Target Date 10/15/21      PT LONG TERM GOAL #2   Title Patient will improve L ankle strenth in all planes of movement to at least 4/5    Baseline 2+/5    Time 6    Target Date 10/15/21      PT LONG TERM GOAL #3   Title Patient will be able to maintain static and dynamic balance during single limb stance on soft surface x at least 15 seconds.    Baseline static standing on one leg x 4 seconds on firm surface.    Time 6    Period Weeks    Status New    Target Date 10/15/21      PT LONG TERM GOAL #4   Title Patient will return to running at least 3 miles (using run/walk) with pain no > 4/10.    Baseline Unable to run    Time 6    Period Weeks    Target Date 10/15/21                   Plan - 09/30/21 1626     Clinical Impression Statement Pt with greatly improving ankle ROM. DF appears almost equal bilat. Inv/eversion improved after joint mobilization. Able to progress pt to more single leg stance activities and plyometrics. Initiating return to run  protocol -- educated pt accordingly. Pt able to tolerate elliptical; discussed if she is able to do this we can drop to every other week.    Personal Factors and Comorbidities Past/Current Experience    Examination-Activity Limitations Other;Stand;Locomotion Level    Examination-Participation Restrictions Yard Work;Other;Cleaning;Community Activity    Stability/Clinical Decision Making Evolving/Moderate complexity    Rehab Potential Excellent    PT Frequency 1x / week    PT Duration Other (comment)   5w   PT Treatment/Interventions ADLs/Self Care Home Management;Iontophoresis 4mg /ml Dexamethasone;Gait training;Stair training;Neuromuscular re-education;Balance training;Therapeutic exercise;Therapeutic activities;Functional mobility training;Patient/family education;Manual techniques    PT Next Visit Plan Continue with manual therapy and joint mobilizations as needed. Progress to fast walking on treadmill/slow jogging. Continue plyometrics and single leg strengthening/stability (consider balance on foam)    PT Home Exercise Plan Access Code: [email or web address]    Consulted and Agree with Plan of Care Patient             Patient will benefit from skilled therapeutic intervention in order to improve the following deficits and impairments:  Difficulty walking, Decreased endurance, Decreased activity tolerance, Pain, Decreased balance, Decreased strength, Increased edema  Visit Diagnosis: Muscle weakness (generalized)  Unsteadiness on feet  Pain in left ankle and joints of left foot     Problem List Patient Active Problem List   Diagnosis Date Noted   Rheumatoid arthritis of multiple sites with negative rheumatoid factor (HCC) 09/30/2021   Sprain of anterior talofibular ligament of left ankle 08/06/2021   S/P primary low transverse C-section 01/10/2020   Pregnancy 01/09/2020   Traumatic injury during pregnancy in third trimester 12/28/2019    [facility] April May, PT, DPT 09/30/2021,  4:30 PM  [facility] [place] [facility] [address] [place], [place], [place] Phone: [phone]   Fax:  [phone]  Name: Colleen [name] MRN: Ed Blalock Date of Birth: March 15, 1986

## 2021-10-07 ENCOUNTER — Ambulatory Visit: Payer: BC Managed Care – PPO | Admitting: Physical Therapy

## 2021-10-14 ENCOUNTER — Ambulatory Visit: Payer: BC Managed Care – PPO | Admitting: Physical Therapy

## 2021-10-15 ENCOUNTER — Ambulatory Visit: Payer: BC Managed Care – PPO | Admitting: Physical Therapy

## 2021-11-08 NOTE — Progress Notes (Signed)
Office Visit Note  Patient: Colleen [name]             Date of Birth: 1986/10/29           MRN: [id number]             PCP: Sharlene Dory, DO Referring: Sharlene Dory* Visit Date: 11/09/2021 Occupation: Music coordinator  Subjective:   History of Present Illness: Noemie Devivo is a 35 y.o. female is here for rheumatoid arthritis currently on hydroxychloroquine 300 mg PO daily.  She was originally diagnosed with this problem about 11 years ago after developing joint pain and stiffness in bilateral shoulders wrists and hands with work-up positive for CCP antibodies and elevated sedimentation rate.  She initially started treatment on hydroxychloroquine for this with Dr. Cardell Peach and then later seeing Dr. Sharmon Revere for this problem for years. No known evidence of joint erosion or significant deforming changes or extrarticular complications. At present her symptoms are well controlled, a few minutes of stiffness in the morning but not much pain and no swelling. She has noticed a small deviation in distal finger joints. She stopped taking hydroxychloroquine during her first pregnancy and experienced some disease flares requiring prednisone for this in 2017. During her second pregnancy last year she continued medication and did not experience similar problems. She had an episode over this summer with increased symptoms but improved on its own without a change in treatments. She sustained a left ankle sprain injury running treated with immobilization and most mostly recovered from this. She sees her eye doctor in [place] with nearsightedness and hydroxychloroquine toxicity monitoring no problem identified so far, most recent appointment in October was rescheduled. She denies new skin rashes or hyperpigmentation areas. No history of raynaud's symptoms but feels cold a large amount of the time.    Review of Systems  Eyes:  Negative for visual disturbance and dryness.   Respiratory:  Negative for shortness of breath.   Endocrine: Positive for cold intolerance.  Musculoskeletal:  Positive for morning stiffness. Negative for joint swelling.  Skin:  Negative for rash.   PMFS History:  Patient Active Problem List   Diagnosis Date Noted   Rheumatoid arthritis of multiple sites with negative rheumatoid factor (HCC) 09/30/2021   Sprain of anterior talofibular ligament of left ankle 08/06/2021   S/P primary low transverse C-section 01/10/2020   Pregnancy 01/09/2020   Traumatic injury during pregnancy in third trimester 12/28/2019    Past Medical History:  Diagnosis Date   Arthritis    Migraine     Family History  Problem Relation Age of Onset   Hypertension Mother    Cancer Maternal Grandmother    Heart disease Maternal Grandfather    Liver cancer Maternal Grandfather    Brain cancer Paternal Grandfather    Past Surgical History:  Procedure Laterality Date   CESAREAN SECTION N/A 01/10/2020   Procedure: CESAREAN SECTION;  Surgeon: Huel Cote, MD;  Location: [facility];  Service: Obstetrics;  Laterality: N/A;   DILATION AND CURETTAGE OF UTERUS     Social History   Social History Narrative   Not on file   Immunization History  Administered Date(s) Administered   PFIZER Comirnaty(Gray Top)Covid-19 Tri-Sucrose Vaccine 02/11/2020, 03/03/2020, 11/24/2020   PNEUMOCOCCAL CONJUGATE-20 09/30/2021   Tdap 12/13/2019     Objective: Vital Signs: BP 124/81 (BP Location: Right Arm, Patient Position: Sitting, Cuff Size: Small)   Pulse 74   Resp 12   Ht 5' 1.5" (1.562  m)   Wt 165 lb 9.6 oz (75.1 kg)   BMI 30.78 kg/m    Physical Exam HENT:     Right Ear: External ear normal.     Left Ear: External ear normal.     Mouth/Throat:     Mouth: Mucous membranes are moist.     Pharynx: Oropharynx is clear.  Eyes:     Conjunctiva/sclera: Conjunctivae normal.  Cardiovascular:     Rate and Rhythm: Normal rate and regular rhythm.  Pulmonary:      Effort: Pulmonary effort is normal.     Breath sounds: Normal breath sounds.  Musculoskeletal:     Right lower leg: No edema.     Left lower leg: No edema.  Skin:    General: Skin is warm and dry.     Findings: No rash.  Neurological:     General: No focal deficit present.     Mental Status: She is alert.     Deep Tendon Reflexes: Reflexes normal.  Psychiatric:        Mood and Affect: Mood normal.     Musculoskeletal Exam:  Neck full ROM no tenderness Shoulders full ROM no tenderness or swelling Elbows full ROM no tenderness or swelling Wrists full ROM no tenderness or swelling Fingers full ROM no tenderness or swelling, slight 3rd DIP joint lateral deviation without significant nodule no tenderness to pressure Knees full ROM no tenderness or swelling Left ankle inversion and eversion ROM decreased soft endpoint, no swelling or tenderness MTPs full ROM no tenderness or swelling   CDAI Exam: CDAI Score: 0  Patient Global: 0 mm; Provider Global: 0 mm Swollen: 0 ; Tender: 0  Joint Exam 11/09/2021   All documented joints were normal     Investigation: No additional findings.  Imaging: No results found.  Recent Labs: Lab Results  Component Value Date   WBC 5.3 09/30/2021   HGB 12.3 09/30/2021   PLT 253.0 09/30/2021   NA 137 09/30/2021   K 4.2 09/30/2021   CL 104 09/30/2021   CO2 28 09/30/2021   GLUCOSE 93 09/30/2021   BUN 13 09/30/2021   CREATININE 0.76 09/30/2021   BILITOT 0.4 09/30/2021   ALKPHOS 68 09/30/2021   AST 16 09/30/2021   ALT 27 09/30/2021   PROT 6.6 09/30/2021   ALBUMIN 4.3 09/30/2021   CALCIUM 9.2 09/30/2021   GFRAA >60 01/09/2020    Speciality Comments: No specialty comments available.  Procedures:  No procedures performed Allergies: Patient has no known allergies.   Assessment / Plan:     Visit Diagnoses: Rheumatoid arthritis of multiple sites with negative rheumatoid factor ([place]) - Plan: Rheumatoid factor, Cyclic citrul peptide  antibody, IgG, Sedimentation rate  Seropositive RA longstanding without complications, currently appears to be in remission on hydroxychloroquine monotherapy. Will repeat serologic markers today. She has recent CBC and CMP were normal last month and regular eye exams without problems. Plan to continue hydroxychloroquine 300 mg PO daily initially closer f/u in 3 mos, may be able to try reducing medication dose if control is excellent.  Orders: Orders Placed This Encounter  Procedures   Rheumatoid factor   Cyclic citrul peptide antibody, IgG   Sedimentation rate    No orders of the defined types were placed in this encounter.    Follow-Up Instructions: Return in about 3 months (around 02/07/2022) for New pt RA on HCQ f/u 56mos.   Collier Salina, MD  Note - This record has been created using [employer].  Chart creation errors have been sought, but may not always  have been located. Such creation errors do not reflect on  the standard of medical care.

## 2021-11-09 ENCOUNTER — Encounter: Payer: Self-pay | Admitting: Internal Medicine

## 2021-11-09 ENCOUNTER — Ambulatory Visit: Payer: BC Managed Care – PPO | Admitting: Internal Medicine

## 2021-11-09 ENCOUNTER — Other Ambulatory Visit: Payer: Self-pay

## 2021-11-09 VITALS — BP 124/81 | HR 74 | Resp 12 | Ht 61.5 in | Wt 165.6 lb

## 2021-11-09 DIAGNOSIS — M0609 Rheumatoid arthritis without rheumatoid factor, multiple sites: Secondary | ICD-10-CM

## 2021-11-09 NOTE — Patient Instructions (Signed)
I recommend we continue your current hydroxychloroquine 300 mg daily dose for now.  You had recent tests with Dr. Carmelia Roller these showed completely normal blood count and kidney and liver function.  I am rechecking rheumatoid factor and CCP antibodies as well as sedimentation rate tests today.  I recommend that we follow up in about 3 months to recheck how everything is going and whether to try an change in medication or dose.

## 2021-11-10 LAB — SEDIMENTATION RATE: Sed Rate: 17 mm/h (ref 0–20)

## 2021-11-10 LAB — RHEUMATOID FACTOR: Rheumatoid fact SerPl-aCnc: <14 IU/mL (ref <14)

## 2021-11-10 LAB — CYCLIC CITRUL PEPTIDE ANTIBODY, IGG: Cyclic Citrullin Peptide Ab: >250 UNITS — ABNORMAL HIGH

## 2021-11-13 ENCOUNTER — Encounter: Payer: Self-pay | Admitting: Family Medicine

## 2021-11-13 ENCOUNTER — Ambulatory Visit: Payer: BC Managed Care – PPO | Admitting: Family Medicine

## 2021-11-13 VITALS — BP 108/68 | HR 80 | Temp 98.4°F | Ht 62.0 in | Wt 164.0 lb

## 2021-11-13 DIAGNOSIS — G43009 Migraine without aura, not intractable, without status migrainosus: Secondary | ICD-10-CM | POA: Diagnosis not present

## 2021-11-13 DIAGNOSIS — F339 Major depressive disorder, recurrent, unspecified: Secondary | ICD-10-CM | POA: Insufficient documentation

## 2021-11-13 MED ORDER — RIZATRIPTAN BENZOATE 10 MG PO TABS: 10.0000 mg | ORAL_TABLET | ORAL | 0 refills | Status: DC | PRN

## 2021-11-13 NOTE — Patient Instructions (Signed)
Aim to do some physical exertion for 150 minutes per week. This is typically divided into 5 days per week, 30 minutes per day. The activity should be enough to get your heart rate up. Anything is better than nothing if you have time constraints.  Please consider counseling. Contact [phone] to schedule an appointment or inquire about cost/insurance coverage.  [facility] located at [address], [place], [place].  Phone number = [phone].  Dr. Onoriode Edeh - Adult Psychiatry.    [facility] located at [address], [place], [place]. Phone number = [phone].   [facility] located at [address], [place], [place].  Phone number = [phone].   [facility] located at [address], [place], [place].  Phone number = [phone].  Let us know if you need anything.  

## 2021-11-13 NOTE — Progress Notes (Signed)
Chief Complaint  Patient presents with   Follow-up    6 week    Subjective Colleen [name] presents for f/u anxiety/depression.  Pt is currently being treated with Zoloft 50 mg/d.  Reports 50% improvement since treatment. No thoughts of harming self or others. No self-medication with alcohol, prescription drugs or illicit drugs. Pt is not following with a counselor/psychologist.  Migraines-patient had around 4 migraine since her last visit.  She use the Imitrex twice.  On one occasion, she may waited too long and it did not help at all.  On the other occasion it helped with light and sound sensitivity.  She does not think she gets auras with her migraines.  She is wondering if she is using it incorrectly or if it just is not working.  She does not currently have a headache.  Past Medical History:  Diagnosis Date   Arthritis    Migraine    Allergies as of 11/13/2021   No Known Allergies      Medication List        Accurate as of November 13, 2021  9:39 AM. If you have any questions, ask your nurse or doctor.          STOP taking these medications    SUMAtriptan 100 MG tablet Commonly known as: Imitrex Stopped by: Sharlene Dory, DO       TAKE these medications    hydroxychloroquine 200 MG tablet Commonly known as: PLAQUENIL Take 300 mg by mouth daily.   ondansetron 4 MG disintegrating tablet Commonly known as: ZOFRAN-ODT Take 1 tablet (4 mg total) by mouth every 8 (eight) hours as needed for nausea or vomiting.   rizatriptan 10 MG tablet Commonly known as: Maxalt Take 1 tablet (10 mg total) by mouth as needed for migraine. May repeat in 2 hours if needed Started by: Sharlene Dory, DO   sertraline 50 MG tablet Commonly known as: ZOLOFT Take 50 mg by mouth daily.        Exam BP 108/68    Pulse 80    Temp 98.4 F (36.9 C) (Oral)    Ht 5\' 2"  (1.575 m)    Wt 164 lb (74.4 kg)    SpO2 97%    BMI 30.00 kg/m  General:  well  developed, well nourished, in no apparent distress Heart: RRR Lungs: CTAB.  No respiratory distress Neuro: Gait is normal, DTRs equal and symmetric throughout, no clonus, no cerebellar signs MSK: No tenderness in the cervical paraspinal region or suboccipital triangle, no tenderness over the trapezius bilaterally Psych: well oriented with normal range of affect and age-appropriate judgement/insight, alert and oriented x4.  Assessment and Plan  Depression, recurrent (HCC)  Migraine without aura and without status migrainosus, not intractable - Plan: rizatriptan (MAXALT) 10 MG tablet  Chronic, stable.  Continue Zoloft 50 mg daily.  Counseling resources provided in her paperwork.   Chronic, unstable.  Stop Imitrex, start Maxalt 10 mg as needed.  F/u in 1 month if not doing well, I will see her in 6 months if she is doing well. The patient voiced understanding and agreement to the plan.  [place], DO 11/13/21 9:39 AM

## 2021-12-04 ENCOUNTER — Other Ambulatory Visit: Payer: Self-pay | Admitting: Family Medicine

## 2021-12-05 NOTE — Telephone Encounter (Signed)
I don't mind filling this for a short period of time, but could we make sure she didn't mean to send this to the rheumatology team please? Ty.

## 2021-12-07 ENCOUNTER — Encounter: Payer: Self-pay | Admitting: Internal Medicine

## 2021-12-07 MED ORDER — HYDROXYCHLOROQUINE SULFATE 200 MG PO TABS: 300.0000 mg | ORAL_TABLET | Freq: Every day | ORAL | 0 refills | Status: DC

## 2021-12-07 NOTE — Telephone Encounter (Signed)
Next Visit: 02/08/2022  Last Visit: 11/19/2021  Labs: 09/30/2021 WNL  Eye exam:  11/25/2021 normal (regular eye exam). VF & OCT scheduled in 2 months.   Current Dose per office note 11/09/2021: hydroxychloroquine 300 mg PO daily   [id number] arthritis of multiple sites with negative rheumatoid factor   Okay to refill Plaquenil?

## 2021-12-07 NOTE — Telephone Encounter (Signed)
Was meant for Rheumatology.  She will contact that office.

## 2022-01-22 DIAGNOSIS — Z79899 Other long term (current) drug therapy: Secondary | ICD-10-CM | POA: Diagnosis not present

## 2022-01-22 DIAGNOSIS — M069 Rheumatoid arthritis, unspecified: Secondary | ICD-10-CM | POA: Diagnosis not present

## 2022-01-25 ENCOUNTER — Encounter: Payer: Self-pay | Admitting: Family Medicine

## 2022-01-25 ENCOUNTER — Telehealth: Payer: Self-pay | Admitting: Family Medicine

## 2022-01-25 ENCOUNTER — Ambulatory Visit: Payer: BC Managed Care – PPO | Admitting: Family Medicine

## 2022-01-25 VITALS — BP 110/60 | HR 80 | Temp 98.0°F | Resp 16 | Ht 63.0 in | Wt 166.4 lb

## 2022-01-25 DIAGNOSIS — Z0289 Encounter for other administrative examinations: Secondary | ICD-10-CM

## 2022-01-25 DIAGNOSIS — Z111 Encounter for screening for respiratory tuberculosis: Secondary | ICD-10-CM | POA: Diagnosis not present

## 2022-01-25 NOTE — Telephone Encounter (Signed)
Have her schedule an office visit with Dr. Carmelia Roller to discuss getting this done.thanks

## 2022-01-25 NOTE — Progress Notes (Signed)
Chief Complaint  Patient presents with   OFFICE VISIT    Here to talk about paperwork     Subjective: Patient is a 36 y.o. female here for f/u.  Starting teaching job in 1 week. Needs form filled out. UTD w vaccinations minus bivalent covid booster. She is not having any pain, balance issues, sob, weakness. Needs Tb tests which she has had before without issue.   Past Medical History:  Diagnosis Date   Arthritis    Migraine     Objective: BP 110/60 (BP Location: Right Arm, Patient Position: Sitting, Cuff Size: Normal)    Pulse 80    Temp 98 F (36.7 C) (Oral)    Resp 16    Ht 5\' 3"  (1.6 m)    Wt 166 lb 6.4 oz (75.5 kg)    SpO2 99%    BMI 29.48 kg/m  General: Awake, appears stated age Heart: RRR, no LE edema Lungs: CTAB, no rales, wheezes or rhonchi. No accessory muscle use Neuro: 5/5 strength throughout, gait normal Psych: Age appropriate judgment and insight, normal affect and mood  Assessment and Plan: Encounter for completion of form with patient  Screening for tuberculosis   Form completed for teaching. She has no restrictions mentally or physically and is UTD w major vaccinations. TB skin test today, return in 2 d for reading it.  The patient voiced understanding and agreement to the plan.  [place], DO 01/25/22  3:55 PM

## 2022-01-25 NOTE — Telephone Encounter (Signed)
Patient needs a TB skin test done for her job, she would like to come in and do it as soon as possible. Please advise.

## 2022-01-25 NOTE — Telephone Encounter (Signed)
Called pt and appt made for today

## 2022-01-25 NOTE — Patient Instructions (Signed)
Please wait at least 48 hours before returning to have your test done.   Let us know if you need anything.

## 2022-01-27 ENCOUNTER — Ambulatory Visit: Payer: BC Managed Care – PPO

## 2022-01-27 DIAGNOSIS — Z111 Encounter for screening for respiratory tuberculosis: Secondary | ICD-10-CM

## 2022-01-27 LAB — TB SKIN TEST
Induration: 0 mm
TB Skin Test: NEGATIVE

## 2022-01-27 NOTE — Progress Notes (Signed)
Pt is here today for a TB skin test reading. ?TB test was placed in R forearm on 01/25/22. ?TB test today is Negative. ? ?Paperwork filled out for patient, copy made and placed in scan folder. ?

## 2022-02-08 ENCOUNTER — Ambulatory Visit: Payer: BC Managed Care – PPO | Admitting: Internal Medicine

## 2022-03-22 DIAGNOSIS — Z6830 Body mass index (BMI) 30.0-30.9, adult: Secondary | ICD-10-CM | POA: Diagnosis not present

## 2022-03-22 DIAGNOSIS — Z01419 Encounter for gynecological examination (general) (routine) without abnormal findings: Secondary | ICD-10-CM | POA: Diagnosis not present

## 2022-03-22 DIAGNOSIS — Z130 Encounter for screening for diseases of the blood and blood-forming organs and certain disorders involving the immune mechanism: Secondary | ICD-10-CM | POA: Diagnosis not present

## 2022-03-22 DIAGNOSIS — Z1389 Encounter for screening for other disorder: Secondary | ICD-10-CM | POA: Diagnosis not present

## 2022-03-30 NOTE — Progress Notes (Signed)
Office Visit Note  Patient: Colleen [name]             Date of Birth: 08/15/1986           MRN: [id number]             PCP: Shelda Pal, DO Referring: Shelda Pal* Visit Date: 03/31/2022   Subjective:  Arthritis (Doing good)   History of Present Illness: Colleen [name] is a 36 y.o. female here for follow up for RA on HCQ 300 mg daily. Symptoms appeared well controlled at last visit with normal ESR highly positive CCP Abs. She is feeling very well with no daily arthritis symptoms. She estimates taking the HCQ about 75% of days due to forgetting sometimes. Left ankle is improved since last visit.   Previous HPI 11/09/21 Colleen [name] is a 36 y.o. female is here for rheumatoid arthritis currently on hydroxychloroquine 300 mg PO daily.  She was originally diagnosed with this problem about 11 years ago after developing joint pain and stiffness in bilateral shoulders wrists and hands with work-up positive for CCP antibodies and elevated sedimentation rate.  She initially started treatment on hydroxychloroquine for this with Dr. Abner Greenspan and then later seeing Dr. Gerilyn Nestle for this problem for years. No known evidence of joint erosion or significant deforming changes or extrarticular complications. At present her symptoms are well controlled, a few minutes of stiffness in the morning but not much pain and no swelling. She has noticed a small deviation in distal finger joints. She stopped taking hydroxychloroquine during her first pregnancy and experienced some disease flares requiring prednisone for this in 2017. During her second pregnancy last year she continued medication and did not experience similar problems. She had an episode over this summer with increased symptoms but improved on its own without a change in treatments. She sustained a left ankle sprain injury running treated with immobilization and most mostly recovered from this. She sees her eye  doctor in [place] with nearsightedness and hydroxychloroquine toxicity monitoring no problem identified so far, most recent appointment in October was rescheduled. She denies new skin rashes or hyperpigmentation areas. No history of raynaud's symptoms but feels cold a large amount of the time.   Review of Systems  Constitutional:  Positive for fatigue.  HENT:  Negative for mouth dryness.   Eyes:  Negative for dryness.  Respiratory:  Negative for shortness of breath.   Cardiovascular:  Negative for swelling in legs/feet.  Gastrointestinal:  Positive for diarrhea.  Endocrine: Negative for excessive thirst.  Genitourinary:  Negative for difficulty urinating.  Musculoskeletal:  Positive for morning stiffness.  Skin:  Negative for rash.  Allergic/Immunologic: Negative for susceptible to infections.  Neurological:  Negative for numbness.  Hematological:  Negative for bruising/bleeding tendency.  Psychiatric/Behavioral:  Positive for sleep disturbance.    PMFS History:  Patient Active Problem List   Diagnosis Date Noted   Depression, recurrent ([place]) 11/13/2021   Migraine without aura and without status migrainosus, not intractable 11/13/2021   Rheumatoid arthritis of multiple sites with negative rheumatoid factor ([place]) 09/30/2021   Sprain of anterior talofibular ligament of left ankle 08/06/2021   S/P primary low transverse C-section 01/10/2020   Pregnancy 01/09/2020   Traumatic injury during pregnancy in third trimester 12/28/2019    Past Medical History:  Diagnosis Date   Arthritis    Migraine     Family History  Problem Relation Age of Onset   Hypertension Mother    Cancer  Maternal Grandmother    Heart disease Maternal Grandfather    Liver cancer Maternal Grandfather    Brain cancer Paternal Grandfather    Past Surgical History:  Procedure Laterality Date   CESAREAN SECTION N/A 01/10/2020   Procedure: CESAREAN SECTION;  Surgeon: Paula Compton, MD;  Location: [place] LD  ORS;  Service: Obstetrics;  Laterality: N/A;   DILATION AND CURETTAGE OF UTERUS     Social History   Social History Narrative   Not on file   Immunization History  Administered Date(s) Administered   PFIZER Comirnaty(Gray Top)Covid-19 Tri-Sucrose Vaccine 02/11/2020, 03/03/2020, 11/24/2020   PNEUMOCOCCAL CONJUGATE-20 09/30/2021   PPD Test 01/25/2022   Tdap 12/13/2019     Objective: Vital Signs: BP (!) 105/54 (BP Location: Left Arm, Patient Position: Sitting, Cuff Size: Normal)   Pulse 64   Resp 14   Ht 5' 2"  (1.575 m)   Wt 166 lb (75.3 kg)   BMI 30.36 kg/m    Physical Exam Cardiovascular:     Rate and Rhythm: Normal rate and regular rhythm.  Pulmonary:     Effort: Pulmonary effort is normal.     Breath sounds: Normal breath sounds.  Musculoskeletal:     Right lower leg: No edema.     Left lower leg: No edema.  Skin:    General: Skin is warm and dry.     Findings: No rash.  Neurological:     Mental Status: She is alert.  Psychiatric:        Mood and Affect: Mood normal.     Musculoskeletal Exam:  Shoulders full ROM no tenderness or swelling Elbows full ROM no tenderness or swelling Wrists full ROM no tenderness or swelling Fingers full ROM no tenderness or swelling Knees full ROM no tenderness or swelling, mild join laxity to varus and valgus pressure and slight hyperextensibility Ankles full ROM no tenderness or swelling   CDAI Exam: CDAI Score: 0  Patient Global: 0 mm; Provider Global: 0 mm Swollen: 0 ; Tender: 0  Joint Exam 03/31/2022   All documented joints were normal     Investigation: No additional findings.  Imaging: No results found.  Recent Labs: Lab Results  Component Value Date   WBC 5.3 09/30/2021   HGB 12.3 09/30/2021   PLT 253.0 09/30/2021   NA 137 09/30/2021   K 4.2 09/30/2021   CL 104 09/30/2021   CO2 28 09/30/2021   GLUCOSE 93 09/30/2021   BUN 13 09/30/2021   CREATININE 0.76 09/30/2021   BILITOT 0.4 09/30/2021   ALKPHOS  68 09/30/2021   AST 16 09/30/2021   ALT 27 09/30/2021   PROT 6.6 09/30/2021   ALBUMIN 4.3 09/30/2021   CALCIUM 9.2 09/30/2021   GFRAA >60 01/09/2020    Speciality Comments: PLQ eye exam: 11/25/2021 normal (regular eye exam). VF & OCT scheduled in 2 months.  Procedures:  No procedures performed Allergies: Patient has no known allergies.   Assessment / Plan:     Visit Diagnoses: Rheumatoid arthritis of multiple sites with negative rheumatoid factor ([place]) - Plan: hydroxychloroquine (PLAQUENIL) 200 MG tablet  Symptoms remain pretty well controlled despite decreasing RA medication dose.  No active synovitis on exam today.  I recommend she can try decreasing to 200 mg daily for the next month and then try stopping the hydroxychloroquine.  If she experiences a worsening of joint pain or swelling symptoms can resume the medication or arrange to follow-up for reassessment.  Orders: No orders of the defined types were placed  in this encounter.  Meds ordered this encounter  Medications   hydroxychloroquine (PLAQUENIL) 200 MG tablet    Sig: Take 1 tablet (200 mg total) by mouth daily.    Dispense:  30 tablet    Refill:  0     Follow-Up Instructions: Return in about 6 months (around 10/01/2022) for RA tapering HCQ off f/u 20mo.   CCollier Salina MD  Note - This record has been created using [employer]  Chart creation errors have been sought, but may not always  have been located. Such creation errors do not reflect on  the standard of medical care.

## 2022-03-31 ENCOUNTER — Encounter: Payer: Self-pay | Admitting: Internal Medicine

## 2022-03-31 ENCOUNTER — Ambulatory Visit: Payer: BC Managed Care – PPO | Admitting: Internal Medicine

## 2022-03-31 VITALS — BP 105/54 | HR 64 | Resp 14 | Ht 62.0 in | Wt 166.0 lb

## 2022-03-31 DIAGNOSIS — M0609 Rheumatoid arthritis without rheumatoid factor, multiple sites: Secondary | ICD-10-CM | POA: Diagnosis not present

## 2022-03-31 MED ORDER — HYDROXYCHLOROQUINE SULFATE 200 MG PO TABS: 200.0000 mg | ORAL_TABLET | Freq: Every day | ORAL | 0 refills | Status: AC

## 2022-05-14 ENCOUNTER — Ambulatory Visit: Payer: BC Managed Care – PPO | Admitting: Family Medicine

## 2022-05-14 DIAGNOSIS — Z3181 Encounter for male factor infertility in female patient: Secondary | ICD-10-CM | POA: Diagnosis not present

## 2022-06-30 ENCOUNTER — Encounter: Payer: Self-pay | Admitting: Internal Medicine

## 2022-06-30 DIAGNOSIS — M0609 Rheumatoid arthritis without rheumatoid factor, multiple sites: Secondary | ICD-10-CM

## 2022-07-12 ENCOUNTER — Other Ambulatory Visit: Payer: Self-pay | Admitting: *Deleted

## 2022-07-12 DIAGNOSIS — M0609 Rheumatoid arthritis without rheumatoid factor, multiple sites: Secondary | ICD-10-CM

## 2022-07-12 MED ORDER — HYDROXYCHLOROQUINE SULFATE 200 MG PO TABS: 200.0000 mg | ORAL_TABLET | Freq: Every day | ORAL | 2 refills | Status: DC

## 2022-07-12 NOTE — Telephone Encounter (Signed)
I spoke with Ms. Dino she is seeing increased joint pain and stiffness in several areas including affecting both hands as well as trouble in her knees.  Symptoms are most severe with morning stiffness.  She also feels some generalized fatigue and decreased energy level.  I recommend she could come back for checking her inflammatory markers sed rate and CRP also repeating CBC and BMP for hydroxychloroquine monitoring.  We will go ahead and send prescription to resume hydroxychloroquine 300 mg daily.

## 2022-07-13 DIAGNOSIS — M0609 Rheumatoid arthritis without rheumatoid factor, multiple sites: Secondary | ICD-10-CM | POA: Diagnosis not present

## 2022-07-14 LAB — COMPLETE METABOLIC PANEL WITH GFR
AG Ratio: 1.7 (calc) (ref 1.0–2.5)
ALT: 25 U/L (ref 6–29)
AST: 16 U/L (ref 10–30)
Albumin: 4 g/dL (ref 3.6–5.1)
Alkaline phosphatase (APISO): 66 U/L (ref 31–125)
BUN: 11 mg/dL (ref 7–25)
CO2: 24 mmol/L (ref 20–32)
Calcium: 9.4 mg/dL (ref 8.6–10.2)
Chloride: 103 mmol/L (ref 98–110)
Creat: 0.78 mg/dL (ref 0.50–0.97)
Globulin: 2.4 g/dL (calc) (ref 1.9–3.7)
Glucose, Bld: 139 mg/dL — ABNORMAL HIGH (ref 65–99)
Potassium: 3.6 mmol/L (ref 3.5–5.3)
Sodium: 137 mmol/L (ref 135–146)
Total Bilirubin: 0.4 mg/dL (ref 0.2–1.2)
Total Protein: 6.4 g/dL (ref 6.1–8.1)
eGFR: 102 mL/min/{1.73_m2} (ref >=60)

## 2022-07-14 LAB — CBC WITH DIFFERENTIAL/PLATELET
Absolute Monocytes: 389 cells/uL (ref 200–950)
Basophils Absolute: 32 cells/uL (ref 0–200)
Basophils Relative: 0.4 %
Eosinophils Absolute: 138 cells/uL (ref 15–500)
Eosinophils Relative: 1.7 %
HCT: 37.6 % (ref 35.0–45.0)
Hemoglobin: 12.6 g/dL (ref 11.7–15.5)
Lymphs Abs: 1709 cells/uL (ref 850–3900)
MCH: 30.9 pg (ref 27.0–33.0)
MCHC: 33.5 g/dL (ref 32.0–36.0)
MCV: 92.2 fL (ref 80.0–100.0)
MPV: 10.8 fL (ref 7.5–12.5)
Monocytes Relative: 4.8 %
Neutro Abs: 5832 cells/uL (ref 1500–7800)
Neutrophils Relative %: 72 %
Platelets: 264 10*3/uL (ref 140–400)
RBC: 4.08 10*6/uL (ref 3.80–5.10)
RDW: 11.8 % (ref 11.0–15.0)
Total Lymphocyte: 21.1 %
WBC: 8.1 10*3/uL (ref 3.8–10.8)

## 2022-07-14 LAB — SEDIMENTATION RATE: Sed Rate: 11 mm/h (ref 0–20)

## 2022-07-14 LAB — C-REACTIVE PROTEIN: CRP: 3.4 mg/L (ref <8.0)

## 2022-07-14 NOTE — Progress Notes (Signed)
Lab results look good no increase in inflammatory markers her blood count and metabolic panel look normal.  Agree with resuming the hydroxychloroquine and we can follow-up to see if she is noticing improvement in the reported symptoms.

## 2022-08-09 ENCOUNTER — Encounter: Payer: Self-pay | Admitting: Family Medicine

## 2022-08-11 ENCOUNTER — Telehealth: Payer: Self-pay | Admitting: Family Medicine

## 2022-08-11 ENCOUNTER — Telehealth: Payer: BC Managed Care – PPO | Admitting: Family Medicine

## 2022-08-11 ENCOUNTER — Encounter: Payer: Self-pay | Admitting: Family Medicine

## 2022-08-11 DIAGNOSIS — Z7184 Encounter for health counseling related to travel: Secondary | ICD-10-CM

## 2022-08-11 MED ORDER — ONDANSETRON 4 MG PO TBDP: 4.0000 mg | ORAL_TABLET | Freq: Three times a day (TID) | ORAL | 0 refills | Status: DC | PRN

## 2022-08-11 MED ORDER — SCOPOLAMINE 1 MG/3DAYS TD PT72: 1.0000 | MEDICATED_PATCH | TRANSDERMAL | 0 refills | Status: DC

## 2022-08-11 MED ORDER — AZITHROMYCIN 500 MG PO TABS: ORAL_TABLET | ORAL | 0 refills | Status: DC

## 2022-08-11 NOTE — Telephone Encounter (Signed)
Colleen [name] from [facility] calling to get confirmation from provider regarding interaction. Patient takes Plaquenil and it has interactions with the antibiotic that was called in for traveler's diarrhea. Colleen [name] just wants to confirm provider is aware. Please call her at [phone] to advise.

## 2022-08-11 NOTE — Telephone Encounter (Signed)
Called [facility] and informed of PCP instructions.

## 2022-08-11 NOTE — Progress Notes (Signed)
Chief Complaint  Patient presents with   travel medicine    Subjective: Patient is a 36 y.o. female here for travel medicine update.  Going on [place] cruise soon. Has hx of sea sickness. Dramamine in the past made her very drowsy. She has a hx of migraines and does well on Zofran but is running low.   Past Medical History:  Diagnosis Date   Arthritis    Migraine     Objective: No conversational dyspnea Age appropriate judgment and insight Nml affect and mood  Assessment and Plan: Travel advice encounter - Plan: azithromycin (ZITHROMAX) 500 MG tablet, ondansetron (ZOFRAN-ODT) 4 MG disintegrating tablet, scopolamine (TRANSDERM-SCOP) 1 MG/3DAYS  Scop patch q 3 d prn. Zofran prn nausea. Zithromax 500 mg qd for 3 d if she has traveller's diarrhea or bacterial pneumonia on the cruise. F/u as originally scheduled.  The patient voiced understanding and agreement to the plan.  Jilda Roche [place], DO 08/11/22  10:04 AM

## 2022-09-22 NOTE — Progress Notes (Deleted)
Office Visit Note  Patient: Colleen [name]             Date of Birth: 1986/05/12           MRN: [id number]             PCP: Shelda Pal, DO Referring: Shelda Pal* Visit Date: 10/01/2022   Subjective:  No chief complaint on file.   History of Present Illness: Colleen [name] is a 36 y.o. female here for follow up for RA   Previous HPI 03/31/2022  [name] is a 36 y.o. female here for follow up for RA on HCQ 300 mg daily. Symptoms appeared well controlled at last visit with normal ESR highly positive CCP Abs. She is feeling very well with no daily arthritis symptoms. She estimates taking the HCQ about 75% of days due to forgetting sometimes. Left ankle is improved since last visit.    Previous HPI 11/09/21 [name] is a 36 y.o. female is here for rheumatoid arthritis currently on hydroxychloroquine 300 mg PO daily.  She was originally diagnosed with this problem about 11 years ago after developing joint pain and stiffness in bilateral shoulders wrists and hands with work-up positive for CCP antibodies and elevated sedimentation rate.  She initially started treatment on hydroxychloroquine for this with Dr. Abner Greenspan and then later seeing Dr. Gerilyn Nestle for this problem for years. No known evidence of joint erosion or significant deforming changes or extrarticular complications. At present her symptoms are well controlled, a few minutes of stiffness in the morning but not much pain and no swelling. She has noticed a small deviation in distal finger joints. She stopped taking hydroxychloroquine during her first pregnancy and experienced some disease flares requiring prednisone for this in 2017. During her second pregnancy last year she continued medication and did not experience similar problems. She had an episode over this summer with increased symptoms but improved on its own without a change in treatments. She sustained a left ankle  sprain injury running treated with immobilization and most mostly recovered from this. She sees her eye doctor in [place] with nearsightedness and hydroxychloroquine toxicity monitoring no problem identified so far, most recent appointment in October was rescheduled. She denies new skin rashes or hyperpigmentation areas. No history of raynaud's symptoms but feels cold a large amount of the time.   No Rheumatology ROS completed.   PMFS History:  Patient Active Problem List   Diagnosis Date Noted   Depression, recurrent ([place]) 11/13/2021   Migraine without aura and without status migrainosus, not intractable 11/13/2021   Rheumatoid arthritis of multiple sites with negative rheumatoid factor ([place]) 09/30/2021   Sprain of anterior talofibular ligament of left ankle 08/06/2021   S/P primary low transverse C-section 01/10/2020   Pregnancy 01/09/2020   Traumatic injury during pregnancy in third trimester 12/28/2019    Past Medical History:  Diagnosis Date   Arthritis    Migraine     Family History  Problem Relation Age of Onset   Hypertension Mother    Cancer Maternal Grandmother    Heart disease Maternal Grandfather    Liver cancer Maternal Grandfather    Brain cancer Paternal Grandfather    Past Surgical History:  Procedure Laterality Date   CESAREAN SECTION N/A 01/10/2020   Procedure: CESAREAN SECTION;  Surgeon: Paula Compton, MD;  Location: [facility];  Service: Obstetrics;  Laterality: N/A;   DILATION AND CURETTAGE OF UTERUS     Social History  Social History Narrative   Not on file   Immunization History  Administered Date(s) Administered   PFIZER Comirnaty(Gray Top)Covid-19 Tri-Sucrose Vaccine 02/11/2020, 03/03/2020, 11/24/2020   PNEUMOCOCCAL CONJUGATE-20 09/30/2021   PPD Test 01/25/2022   Tdap 12/13/2019     Objective: Vital Signs: There were no vitals taken for this visit.   Physical Exam   Musculoskeletal Exam: ***  CDAI Exam: CDAI Score: -- Patient  Global: --; Provider Global: -- Swollen: --; Tender: -- Joint Exam 10/01/2022   No joint exam has been documented for this visit   There is currently no information documented on the homunculus. Go to the Rheumatology activity and complete the homunculus joint exam.  Investigation: No additional findings.  Imaging: No results found.  Recent Labs: Lab Results  Component Value Date   WBC 8.1 07/13/2022   HGB 12.6 07/13/2022   PLT 264 07/13/2022   NA 137 07/13/2022   K 3.6 07/13/2022   CL 103 07/13/2022   CO2 24 07/13/2022   GLUCOSE 139 (H) 07/13/2022   BUN 11 07/13/2022   CREATININE 0.78 07/13/2022   BILITOT 0.4 07/13/2022   ALKPHOS 68 09/30/2021   AST 16 07/13/2022   ALT 25 07/13/2022   PROT 6.4 07/13/2022   ALBUMIN 4.3 09/30/2021   CALCIUM 9.4 07/13/2022   GFRAA >60 01/09/2020    Speciality Comments: PLQ eye exam: 11/25/2021 normal (regular eye exam). VF & OCT scheduled in 2 months.  Procedures:  No procedures performed Allergies: Patient has no known allergies.   Assessment / Plan:     Visit Diagnoses: No diagnosis found.  ***  Orders: No orders of the defined types were placed in this encounter.  No orders of the defined types were placed in this encounter.    Follow-Up Instructions: No follow-ups on file.   Bertram Savin, RT  Note - This record has been created using Editor, commissioning.  Chart creation errors have been sought, but may not always  have been located. Such creation errors do not reflect on  the standard of medical care.

## 2022-10-01 ENCOUNTER — Ambulatory Visit: Payer: BC Managed Care – PPO | Admitting: Internal Medicine

## 2022-10-01 DIAGNOSIS — Z79899 Other long term (current) drug therapy: Secondary | ICD-10-CM

## 2022-10-01 DIAGNOSIS — M0609 Rheumatoid arthritis without rheumatoid factor, multiple sites: Secondary | ICD-10-CM

## 2022-10-01 NOTE — Progress Notes (Unsigned)
Office Visit Note  Patient: Colleen [name]             Date of Birth: 1986-02-14           MRN: [id number]             PCP: Shelda Pal, DO Referring: Shelda Pal* Visit Date: 10/04/2022   Subjective:  No chief complaint on file.   History of Present Illness: Colleen [name] is a 36 y.o. female here for follow up for RA    Previous HPI 03/31/2022 Colleen [name] is a 36 y.o. female here for follow up for RA on HCQ 300 mg daily. Symptoms appeared well controlled at last visit with normal ESR highly positive CCP Abs. She is feeling very well with no daily arthritis symptoms. She estimates taking the HCQ about 75% of days due to forgetting sometimes. Left ankle is improved since last visit.    Previous HPI 11/09/21 Colleen [name] is a 36 y.o. female is here for rheumatoid arthritis currently on hydroxychloroquine 300 mg PO daily.  She was originally diagnosed with this problem about 11 years ago after developing joint pain and stiffness in bilateral shoulders wrists and hands with work-up positive for CCP antibodies and elevated sedimentation rate.  She initially started treatment on hydroxychloroquine for this with Dr. Abner Greenspan and then later seeing Dr. Gerilyn Nestle for this problem for years. No known evidence of joint erosion or significant deforming changes or extrarticular complications. At present her symptoms are well controlled, a few minutes of stiffness in the morning but not much pain and no swelling. She has noticed a small deviation in distal finger joints. She stopped taking hydroxychloroquine during her first pregnancy and experienced some disease flares requiring prednisone for this in 2017. During her second pregnancy last year she continued medication and did not experience similar problems. She had an episode over this summer with increased symptoms but improved on its own without a change in treatments. She sustained a left ankle  sprain injury running treated with immobilization and most mostly recovered from this. She sees her eye doctor in [place] with nearsightedness and hydroxychloroquine toxicity monitoring no problem identified so far, most recent appointment in October was rescheduled. She denies new skin rashes or hyperpigmentation areas. No history of raynaud's symptoms but feels cold a large amount of the time.     No Rheumatology ROS completed.   PMFS History:  Patient Active Problem List   Diagnosis Date Noted   Depression, recurrent ([place]) 11/13/2021   Migraine without aura and without status migrainosus, not intractable 11/13/2021   Rheumatoid arthritis of multiple sites with negative rheumatoid factor ([place]) 09/30/2021   Sprain of anterior talofibular ligament of left ankle 08/06/2021   S/P primary low transverse C-section 01/10/2020   Pregnancy 01/09/2020   Traumatic injury during pregnancy in third trimester 12/28/2019    Past Medical History:  Diagnosis Date   Arthritis    Migraine     Family History  Problem Relation Age of Onset   Hypertension Mother    Cancer Maternal Grandmother    Heart disease Maternal Grandfather    Liver cancer Maternal Grandfather    Brain cancer Paternal Grandfather    Past Surgical History:  Procedure Laterality Date   CESAREAN SECTION N/A 01/10/2020   Procedure: CESAREAN SECTION;  Surgeon: Paula Compton, MD;  Location: [facility];  Service: Obstetrics;  Laterality: N/A;   DILATION AND CURETTAGE OF UTERUS  Social History   Social History Narrative   Not on file   Immunization History  Administered Date(s) Administered   PFIZER Comirnaty(Gray Top)Covid-19 Tri-Sucrose Vaccine 02/11/2020, 03/03/2020, 11/24/2020   PNEUMOCOCCAL CONJUGATE-20 09/30/2021   PPD Test 01/25/2022   Tdap 12/13/2019     Objective: Vital Signs: There were no vitals taken for this visit.   Physical Exam   Musculoskeletal Exam: ***  CDAI Exam: CDAI Score:  -- Patient Global: --; Provider Global: -- Swollen: --; Tender: -- Joint Exam 10/04/2022   No joint exam has been documented for this visit   There is currently no information documented on the homunculus. Go to the Rheumatology activity and complete the homunculus joint exam.  Investigation: No additional findings.  Imaging: No results found.  Recent Labs: Lab Results  Component Value Date   WBC 8.1 07/13/2022   HGB 12.6 07/13/2022   PLT 264 07/13/2022   NA 137 07/13/2022   K 3.6 07/13/2022   CL 103 07/13/2022   CO2 24 07/13/2022   GLUCOSE 139 (H) 07/13/2022   BUN 11 07/13/2022   CREATININE 0.78 07/13/2022   BILITOT 0.4 07/13/2022   ALKPHOS 68 09/30/2021   AST 16 07/13/2022   ALT 25 07/13/2022   PROT 6.4 07/13/2022   ALBUMIN 4.3 09/30/2021   CALCIUM 9.4 07/13/2022   GFRAA >60 01/09/2020    Speciality Comments: PLQ eye exam: 11/25/2021 normal (regular eye exam). VF & OCT scheduled in 2 months.  Procedures:  No procedures performed Allergies: Patient has no known allergies.   Assessment / Plan:     Visit Diagnoses: No diagnosis found.  ***  Orders: No orders of the defined types were placed in this encounter.  No orders of the defined types were placed in this encounter.    Follow-Up Instructions: No follow-ups on file.   Bertram Savin, RT  Note - This record has been created using Editor, commissioning.  Chart creation errors have been sought, but may not always  have been located. Such creation errors do not reflect on  the standard of medical care.

## 2022-10-04 ENCOUNTER — Encounter: Payer: Self-pay | Admitting: Internal Medicine

## 2022-10-04 ENCOUNTER — Ambulatory Visit: Payer: BC Managed Care – PPO | Attending: Internal Medicine | Admitting: Internal Medicine

## 2022-10-04 VITALS — BP 115/77 | HR 70 | Resp 15 | Ht 62.0 in | Wt 171.0 lb

## 2022-10-04 DIAGNOSIS — Z79899 Other long term (current) drug therapy: Secondary | ICD-10-CM | POA: Diagnosis not present

## 2022-10-04 DIAGNOSIS — M25552 Pain in left hip: Secondary | ICD-10-CM

## 2022-10-04 DIAGNOSIS — M0609 Rheumatoid arthritis without rheumatoid factor, multiple sites: Secondary | ICD-10-CM | POA: Diagnosis not present

## 2022-10-04 DIAGNOSIS — M25531 Pain in right wrist: Secondary | ICD-10-CM | POA: Diagnosis not present

## 2022-10-04 DIAGNOSIS — M25551 Pain in right hip: Secondary | ICD-10-CM

## 2022-10-04 MED ORDER — HYDROXYCHLOROQUINE SULFATE 200 MG PO TABS: 300.0000 mg | ORAL_TABLET | Freq: Every day | ORAL | 1 refills | Status: DC

## 2022-10-04 NOTE — Patient Instructions (Addendum)
I suspect your hip pain is related to gluteus medius syndrome, probably from increased running volume. At first I would focus on range of motion exercises and stretching, and can work on strengthening exercises if NOT painful.  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 I would continue resting this and take a once daily NSAID such as aleve 220 mg for at another 1 or 2 weeks if symptoms continue to improve I have attached some exercises you can start adding to strengthen this area. Again focus on exercises only if they are NOT painful.

## 2022-10-05 LAB — CBC WITH DIFFERENTIAL/PLATELET
Absolute Monocytes: 660 cells/uL (ref 200–950)
Basophils Absolute: 38 cells/uL (ref 0–200)
Basophils Relative: 0.5 %
Eosinophils Absolute: 188 cells/uL (ref 15–500)
Eosinophils Relative: 2.5 %
HCT: 37.7 % (ref 35.0–45.0)
Hemoglobin: 12.7 g/dL (ref 11.7–15.5)
Lymphs Abs: 1958 cells/uL (ref 850–3900)
MCH: 30.6 pg (ref 27.0–33.0)
MCHC: 33.7 g/dL (ref 32.0–36.0)
MCV: 90.8 fL (ref 80.0–100.0)
MPV: 10.6 fL (ref 7.5–12.5)
Monocytes Relative: 8.8 %
Neutro Abs: 4658 cells/uL (ref 1500–7800)
Neutrophils Relative %: 62.1 %
Platelets: 291 10*3/uL (ref 140–400)
RBC: 4.15 10*6/uL (ref 3.80–5.10)
RDW: 11.2 % (ref 11.0–15.0)
Total Lymphocyte: 26.1 %
WBC: 7.5 10*3/uL (ref 3.8–10.8)

## 2022-10-05 LAB — BASIC METABOLIC PANEL WITH GFR
BUN: 12 mg/dL (ref 7–25)
CO2: 26 mmol/L (ref 20–32)
Calcium: 9.4 mg/dL (ref 8.6–10.2)
Chloride: 105 mmol/L (ref 98–110)
Creat: 0.71 mg/dL (ref 0.50–0.97)
Glucose, Bld: 83 mg/dL (ref 65–99)
Potassium: 4.2 mmol/L (ref 3.5–5.3)
Sodium: 139 mmol/L (ref 135–146)
eGFR: 114 mL/min/{1.73_m2} (ref >=60)

## 2022-10-05 LAB — SEDIMENTATION RATE: Sed Rate: 17 mm/h (ref 0–20)

## 2022-10-05 NOTE — Progress Notes (Signed)
Lab results look good no problems with sedimentation rate, blood count, or metabolic panel.

## 2022-10-20 ENCOUNTER — Encounter: Payer: Self-pay | Admitting: Family Medicine

## 2022-10-20 ENCOUNTER — Other Ambulatory Visit: Payer: Self-pay | Admitting: Internal Medicine

## 2022-10-20 ENCOUNTER — Ambulatory Visit (INDEPENDENT_AMBULATORY_CARE_PROVIDER_SITE_OTHER): Payer: BC Managed Care – PPO | Admitting: Family Medicine

## 2022-10-20 VITALS — BP 128/80 | HR 71 | Temp 98.2°F | Resp 16 | Ht 61.5 in | Wt 171.0 lb

## 2022-10-20 DIAGNOSIS — G43009 Migraine without aura, not intractable, without status migrainosus: Secondary | ICD-10-CM | POA: Diagnosis not present

## 2022-10-20 DIAGNOSIS — R41840 Attention and concentration deficit: Secondary | ICD-10-CM | POA: Diagnosis not present

## 2022-10-20 DIAGNOSIS — Z0000 Encounter for general adult medical examination without abnormal findings: Secondary | ICD-10-CM | POA: Diagnosis not present

## 2022-10-20 DIAGNOSIS — M0609 Rheumatoid arthritis without rheumatoid factor, multiple sites: Secondary | ICD-10-CM

## 2022-10-20 LAB — COMPREHENSIVE METABOLIC PANEL
ALT: 22 U/L (ref 0–35)
AST: 19 U/L (ref 0–37)
Albumin: 4.3 g/dL (ref 3.5–5.2)
Alkaline Phosphatase: 63 U/L (ref 39–117)
BUN: 15 mg/dL (ref 6–23)
CO2: 28 mEq/L (ref 19–32)
Calcium: 9.1 mg/dL (ref 8.4–10.5)
Chloride: 104 mEq/L (ref 96–112)
Creatinine, Ser: 0.66 mg/dL (ref 0.40–1.20)
GFR: 114.1 mL/min (ref >60.00)
Glucose, Bld: 92 mg/dL (ref 70–99)
Potassium: 4.1 mEq/L (ref 3.5–5.1)
Sodium: 136 mEq/L (ref 135–145)
Total Bilirubin: 0.4 mg/dL (ref 0.2–1.2)
Total Protein: 6.6 g/dL (ref 6.0–8.3)

## 2022-10-20 LAB — LIPID PANEL
Cholesterol: 176 mg/dL (ref 0–200)
HDL: 54.9 mg/dL (ref >39.00)
LDL Cholesterol: 104 mg/dL — ABNORMAL HIGH (ref 0–99)
NonHDL: 121.53
Total CHOL/HDL Ratio: 3
Triglycerides: 87 mg/dL (ref 0.0–149.0)
VLDL: 17.4 mg/dL (ref 0.0–40.0)

## 2022-10-20 LAB — CBC
HCT: 37 % (ref 36.0–46.0)
Hemoglobin: 12.6 g/dL (ref 12.0–15.0)
MCHC: 33.9 g/dL (ref 30.0–36.0)
MCV: 91.2 fl (ref 78.0–100.0)
Platelets: 315 10*3/uL (ref 150.0–400.0)
RBC: 4.06 Mil/uL (ref 3.87–5.11)
RDW: 12.6 % (ref 11.5–15.5)
WBC: 7.1 10*3/uL (ref 4.0–10.5)

## 2022-10-20 MED ORDER — SERTRALINE HCL 50 MG PO TABS: 50.0000 mg | ORAL_TABLET | Freq: Every day | ORAL | 3 refills | Status: DC

## 2022-10-20 MED ORDER — AMPHETAMINE-DEXTROAMPHET ER 20 MG PO CP24: 20.0000 mg | ORAL_CAPSULE | Freq: Every day | ORAL | 0 refills | Status: DC

## 2022-10-20 MED ORDER — RIZATRIPTAN BENZOATE 10 MG PO TABS: 10.0000 mg | ORAL_TABLET | ORAL | 3 refills | Status: DC | PRN

## 2022-10-20 NOTE — Progress Notes (Signed)
CC: CPE   Well Woman Colleen [name] is here for a complete physical.   Her last physical was >1 year ago.  Current diet: in general, diet is healthy overall. Current exercise: runner, Zumba. Contraception? Yes Patient's last menstrual period was 09/10/2022 (exact date). Fatigue out of ordinary? No Seatbelt? Yes Advanced directive? Yes  Health Maintenance Pap/HPV- Yes Tetanus- Yes HIV screening- Yes Hep C screening- Yes  Inattention Since the patient was a young girl, she seems to always be fidgeting and having poor attention.  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  She never had a formal evaluation for ADHD.  Her sister did but never went on medication.  The patient started a new job 4 months ago and has been having trouble staying on task.  She is interested in having a formal evaluation as she does have a history of depression and anxiety.  She is also interested in starting a medication.  Past Medical History:  Diagnosis Date   Arthritis    Migraine      Past Surgical History:  Procedure Laterality Date   CESAREAN SECTION N/A 01/10/2020   Procedure: CESAREAN SECTION;  Surgeon: Huel Cote, MD;  Location: [facility];  Service: Obstetrics;  Laterality: N/A;   DILATION AND CURETTAGE OF UTERUS      Medications  Current Outpatient Medications on File Prior to Visit  Medication Sig Dispense Refill   hydroxychloroquine (PLAQUENIL) 200 MG tablet Take 1.5 tablets (300 mg total) by mouth daily. 135 tablet 1   rizatriptan (MAXALT) 10 MG tablet Take 1 tablet (10 mg total) by mouth as needed for migraine. May repeat in 2 hours if needed 10 tablet 0   sertraline (ZOLOFT) 50 MG tablet Take 50 mg by mouth daily.     Allergies No Known Allergies  Review of Systems: Constitutional:  no unexpected weight changes Eye:  no recent significant change in vision Ear/Nose/Mouth/Throat:  Ears:  no tinnitus or vertigo and no recent change in  hearing Nose/Mouth/Throat:  no complaints of nasal congestion, no sore throat Cardiovascular: no chest pain Respiratory:  no cough and no shortness of breath Gastrointestinal:  no abdominal pain, no change in bowel habits GU:  Female: negative for dysuria or pelvic pain Musculoskeletal/Extremities:  no pain of the joints Integumentary (Skin/Breast):  no abnormal skin lesions reported Neurologic:  no headaches Endocrine:  denies fatigue Hematologic/Lymphatic:  No areas of easy bleeding  Exam BP 128/80 (BP Location: Right Arm, Patient Position: Sitting, Cuff Size: Small)   Pulse 71   Temp 98.2 F (36.8 C) (Oral)   Resp 16   Ht 5' 1.5" (1.562 m)   Wt 171 lb (77.6 kg)   LMP 09/10/2022 (Exact Date)   SpO2 100%   BMI 31.79 kg/m  General:  well developed, well nourished, in no apparent distress Skin:  no significant moles, warts, or growths Head:  no masses, lesions, or tenderness Eyes:  pupils equal and round, sclera anicteric without injection Ears:  canals without lesions, TMs shiny without retraction, no obvious effusion, no erythema Nose:  nares patent, mucosa normal, and no drainage  Throat/Pharynx:  lips and gingiva without lesion; tongue and uvula midline; non-inflamed pharynx; no exudates or postnasal drainage Neck: neck supple without adenopathy, thyromegaly, or masses Lungs:  clear to auscultation, breath sounds equal bilaterally, no respiratory distress Cardio:  regular rate and rhythm, no bruits, no LE edema Abdomen:  abdomen soft, nontender; bowel sounds normal; no masses  or organomegaly Genital: Defer to GYN Musculoskeletal:  symmetrical muscle groups noted without atrophy or deformity Extremities:  no clubbing, cyanosis, or edema, no deformities, no skin discoloration Neuro:  gait normal; deep tendon reflexes normal and symmetric Psych: well oriented with normal range of affect and appropriate judgment/insight  Assessment and Plan  Well adult exam - Plan: CBC,  Comprehensive metabolic panel, Lipid panel  Migraine without aura and without status migrainosus, not intractable - Plan: rizatriptan (MAXALT) 10 MG tablet  Inattention - Plan: Ambulatory referral to Psychology, amphetamine-dextroamphetamine (ADDERALL XR) 20 MG 24 hr capsule   Well 36 y.o. female. Counseled on diet and exercise. Advanced directive form requested today.  Inattention: Chronic, unstable.  Start Adderall XR 20 mg daily.  Recheck in 1 month.  Refer to behavioral health for ADHD evaluation. Other orders as above. The patient voiced understanding and agreement to the plan.  Jilda Roche [place], DO 10/20/22 12:29 PM

## 2022-10-20 NOTE — Patient Instructions (Signed)
Give us 2-3 business days to get the results of your labs back.   Keep the diet clean and stay active.  Please get me a copy of your advanced directive form at your convenience.   Let us know if you need anything.  Please consider counseling. Contact [phone] to schedule an appointment or inquire about cost/insurance coverage.  [facility] located at [address], [place], [place].  Phone number = [phone].  Dr. Onoriode Edeh - Adult Psychiatry.    [facility] located at [address], [place], [place]. Phone number = [phone].   [facility] located at [address], [place], [place].  Phone number = [phone].   [facility] located at [address], [place], [place].  Phone number = [phone].  

## 2022-11-07 IMAGING — CT CT ANGIO HEAD
1 of 8 series · 6 of 33 positions shown · IV contrast (Omnipaque)
Comparison: none

CLINICAL DATA: Neuro deficit, acute, stroke suspected. Rule out
subarachnoid hemorrhage. Headache since yesterday.

EXAM:
CT ANGIOGRAPHY HEAD AND NECK
TECHNIQUE: Multidetector CT imaging of the head and neck was performed using
the standard protocol during bolus administration of intravenous
contrast. Multiplanar CT image reconstructions and MIPs were
obtained to evaluate the vascular anatomy. Carotid stenosis
measurements (when applicable) are obtained utilizing NASCET
criteria, using the distal internal carotid diameter as the
denominator.
CONTRAST:  75mL OMNIPAQUE IOHEXOL 350 MG/ML SOLN

[Series 9: axial thin · axial · 0.39mm/px · z∈[-341,-111]mm · 6 of 322 slices shown]
[im 46/322  soft-tissue]
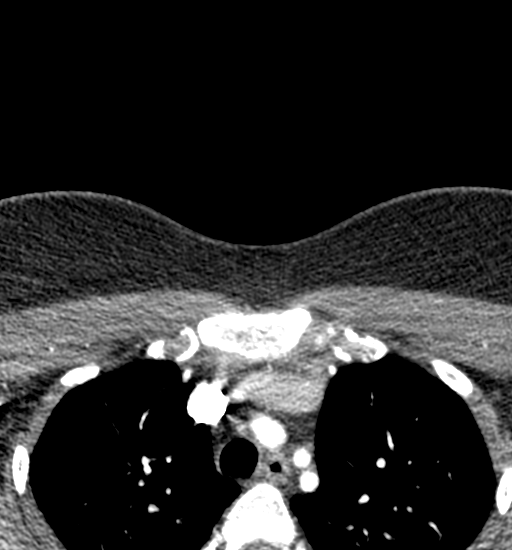
[im 92/322  bone]
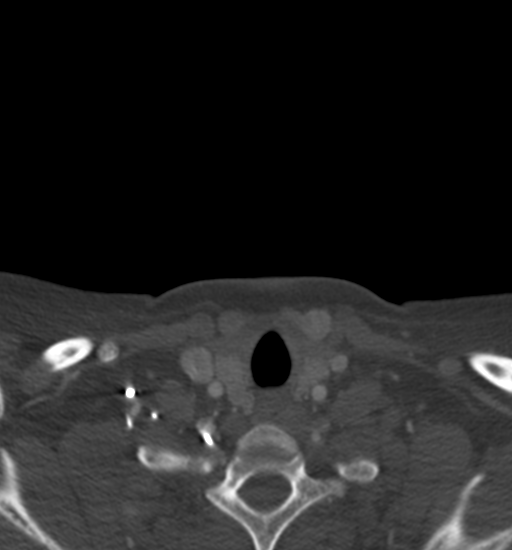
[im 138/322  soft-tissue]
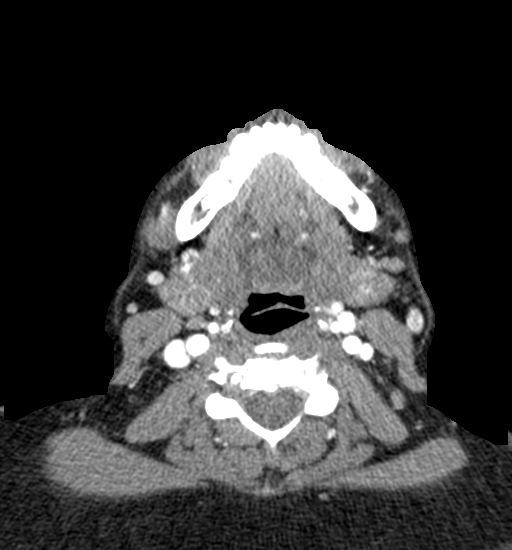
[im 184/322  bone]
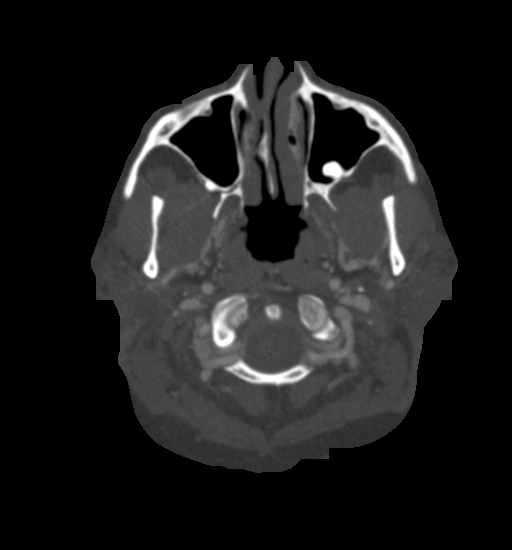
[im 230/322  soft-tissue]
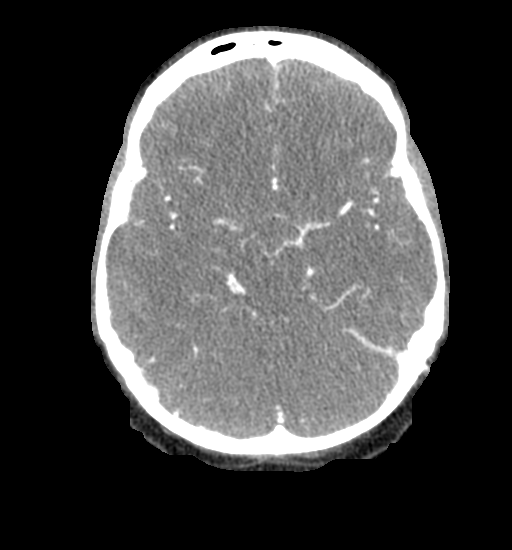
[im 276/322  bone]
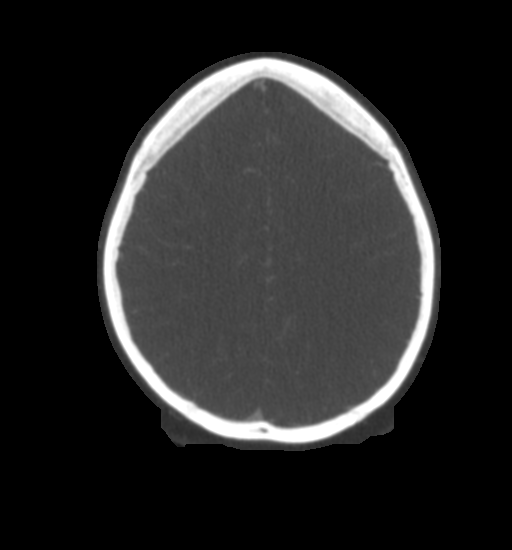

[6 of 33 positions shown; findings below may reference images not displayed]

FINDINGS: CTA NECK FINDINGS

Aortic arch: Standard aortic branching. Streak and beam hardening
artifact arising from a dense right-sided contrast bolus limits
evaluation of the right subclavian artery. Within this limitation,
no hemodynamically significant innominate or proximal subclavian
artery stenosis is appreciated.

Right carotid system: CCA and ICA patent within the neck without
stenosis or significant atherosclerotic disease. No evidence of
dissection

Left carotid system: CCA and ICA patent within the neck without
stenosis or significant atherosclerotic disease. No evidence of
dissection.

Vertebral arteries: Venous reflux limits evaluation of the proximal
V1 right vertebral artery. Within this limitation, the vertebral
arteries are codominant and patent within the neck without stenosis
or evidence of dissection.

Skeleton: No acute bony abnormality or aggressive osseous lesion.

Other neck: No neck mass or cervical lymphadenopathy. Thyroid
unremarkable.

Upper chest: No consolidation within the imaged lung apices.

Review of the MIP images confirms the above findings

CTA HEAD FINDINGS

Anterior circulation:

The intracranial internal carotid arteries are patent. The M1 middle
cerebral arteries are patent. No M2 proximal branch occlusion or
high-grade proximal stenosis is identified. The anterior cerebral
arteries are patent. No intracranial aneurysm is identified. Small
infundibulum arising from the supraclinoid right ICA.

Posterior circulation:

The intracranial vertebral arteries are patent. The basilar artery
is patent. The posterior cerebral arteries are patent. Posterior
communicating arteries are hypoplastic or absent bilaterally.

Venous sinuses: Within the limitations of contrast timing, no
convincing thrombus.

Anatomic variants: As described.

Review of the MIP images confirms the above findings
IMPRESSION: CTA neck:

Venous reflux limits evaluation of the proximal V1 right vertebral
artery. Within this limitation, the common carotid, internal carotid
and vertebral arteries are patent within the neck without
appreciable stenosis or evidence of dissection.

CTA head:

1. No intracranial large vessel occlusion or proximal high-grade
arterial stenosis.
2. No intracranial aneurysm is identified.

## 2022-11-07 IMAGING — CT CT HEAD W/O CM
4 series · 17 of 47 positions shown, 19 images · non-contrast
Comparison: None.

CLINICAL DATA: Headache since yesterday

EXAM:
CT HEAD WITHOUT CONTRAST
TECHNIQUE: Contiguous axial images were obtained from the base of the skull
through the vertex without intravenous contrast.

[Series 2: head wo · axial · 0.42mm/px · z∈[-171,-56]mm · 7 of 31 slices shown, 9 images]
[im 4/31  brain]
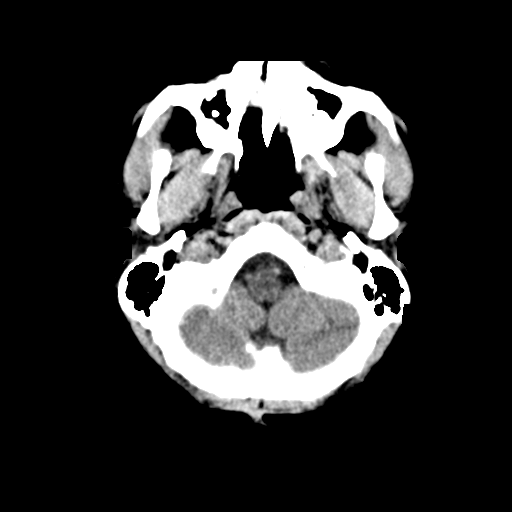
[im 4/31  bone]
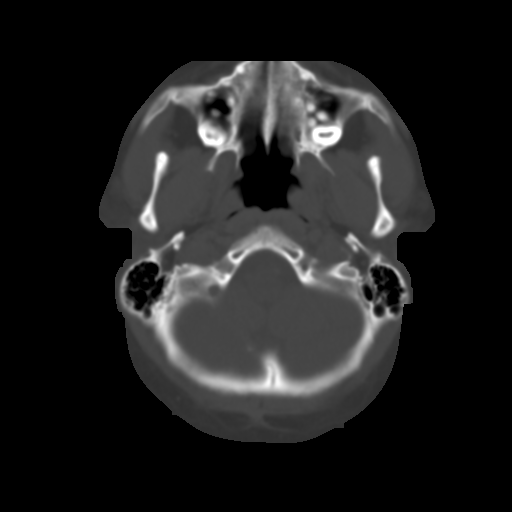
[im 8/31  brain]
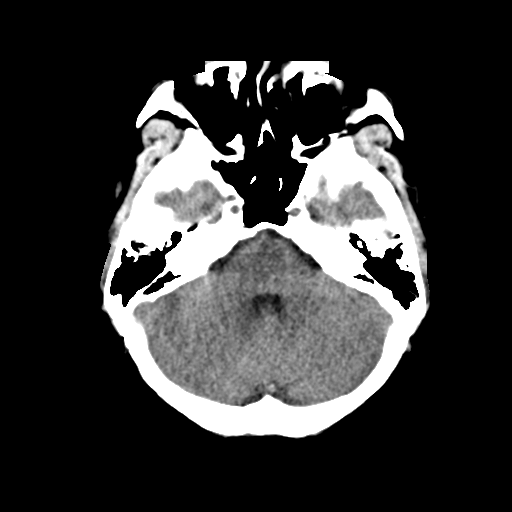
[im 12/31  brain]
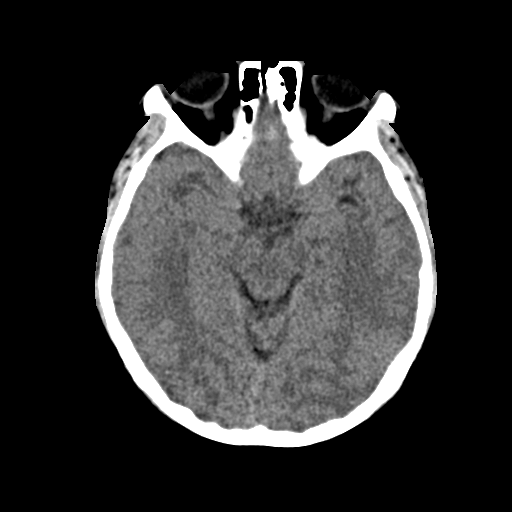
[im 16/31  brain]
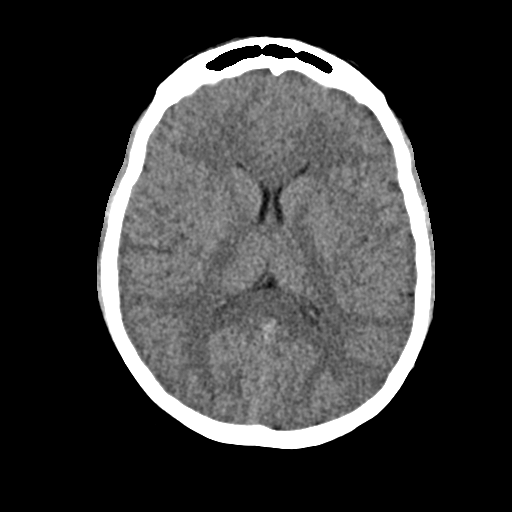
[im 19/31  brain]
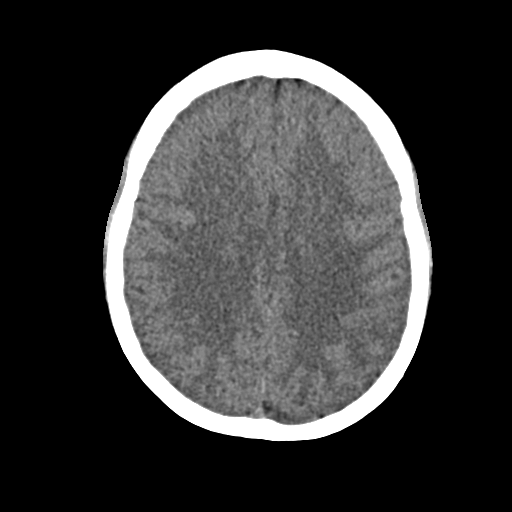
[im 19/31  bone]
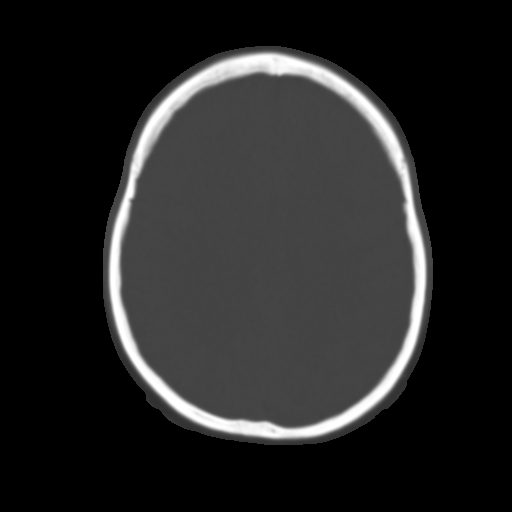
[im 23/31  brain]
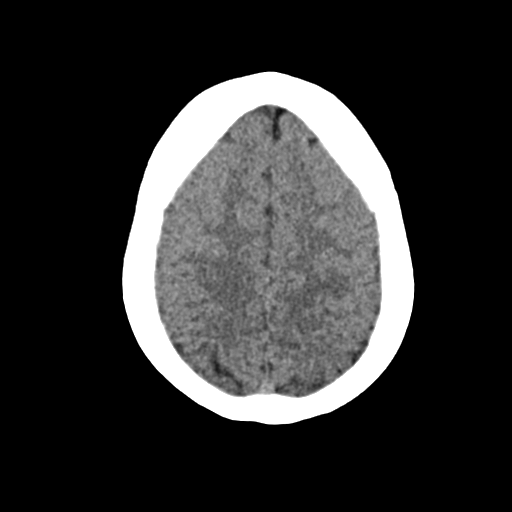
[im 27/31  brain]
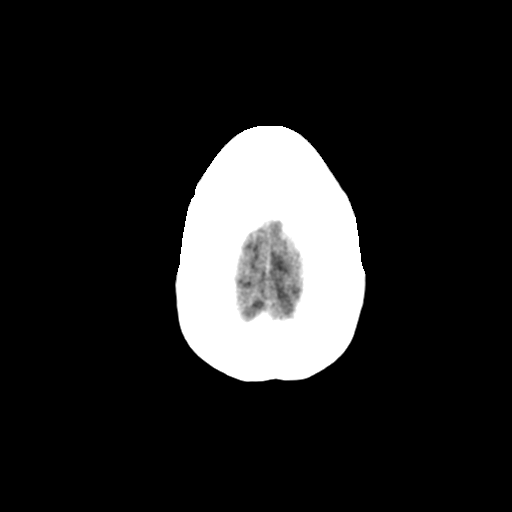

[Series 3: head bone · axial · 0.42mm/px · z∈[-172,-118]mm · 4 of 77 slices shown]
[im 8/77  bone]
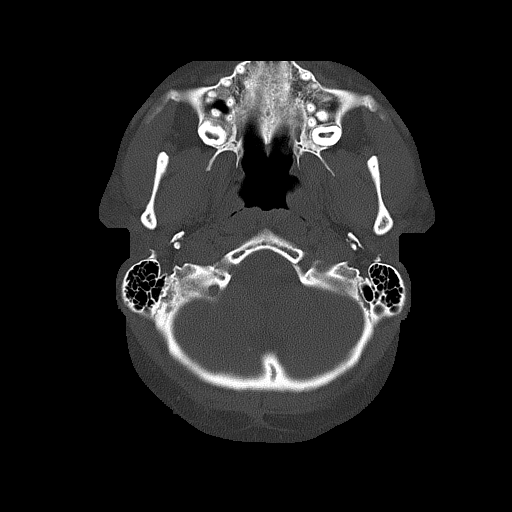
[im 16/77  bone]
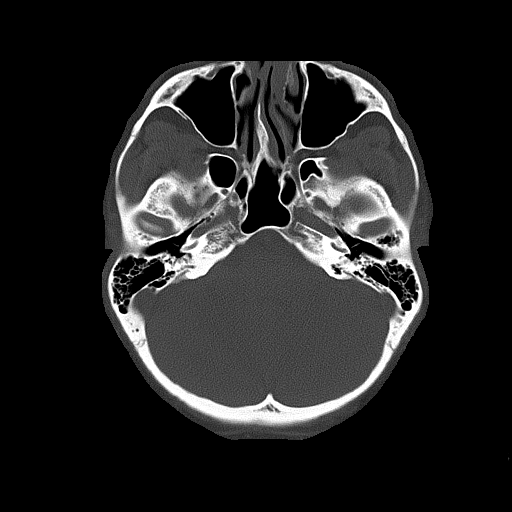
[im 23/77  bone]
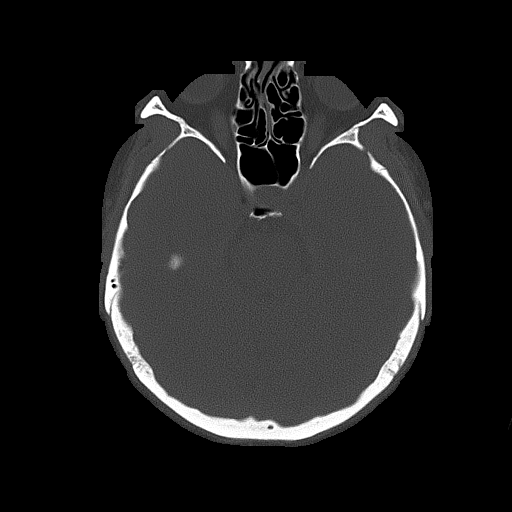
[im 35/77  bone]
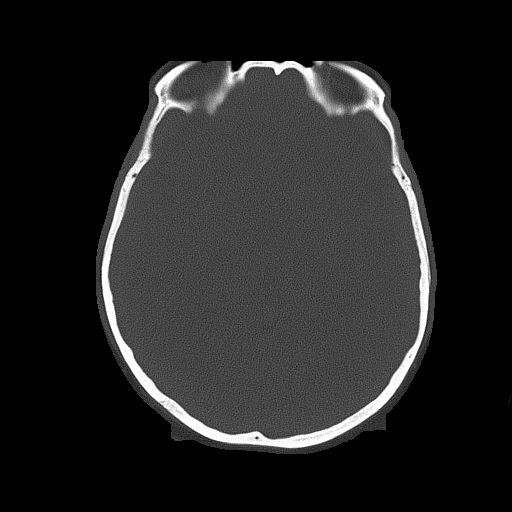

[Series 4: coronal soft · coronal · 0.32mm/px · 3 of 67 slices shown]
[im 23/67  brain]
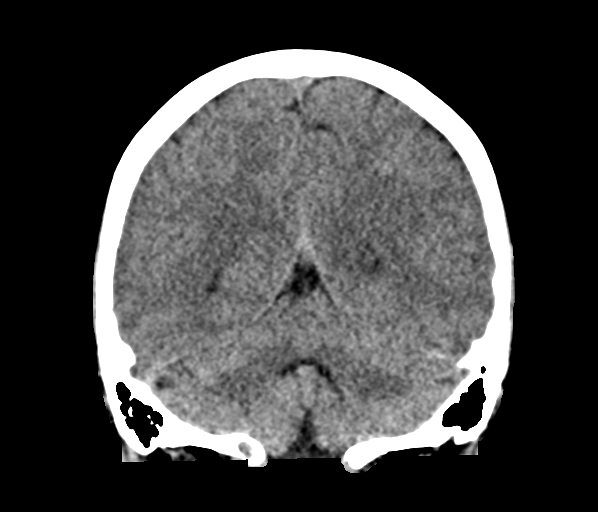
[im 30/67  brain]
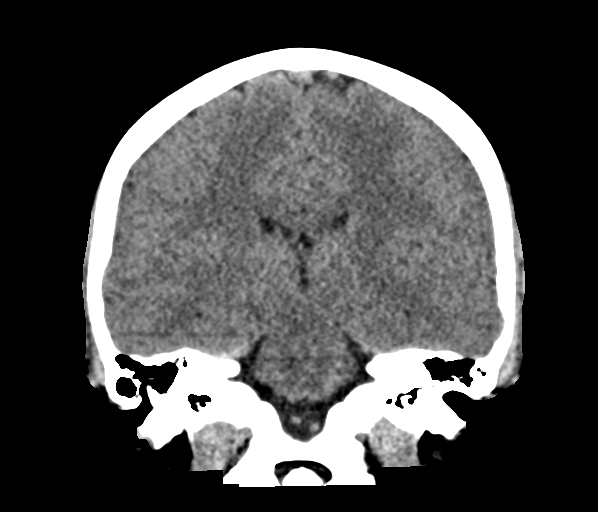
[im 37/67  brain]
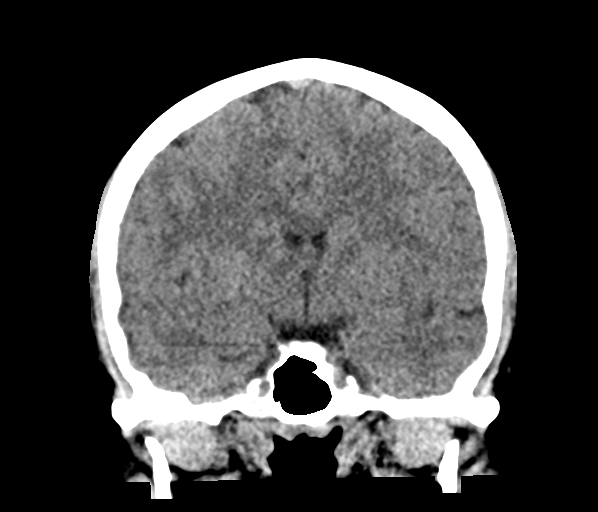

[Series 5: sag soft · sagittal · 0.32mm/px · 3 of 56 slices shown]
[im 19/56  brain]
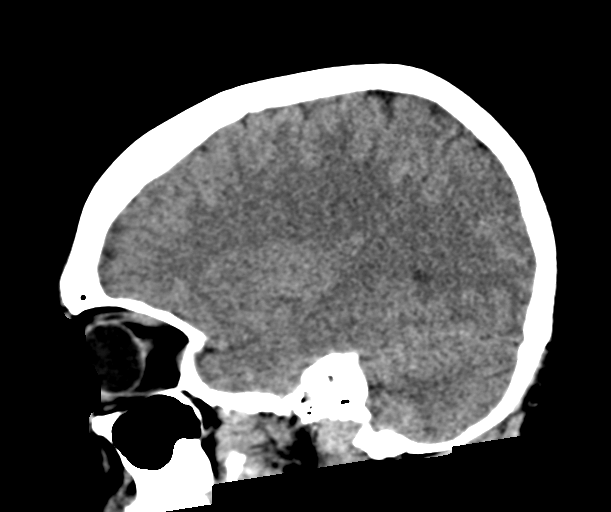
[im 28/56  brain]
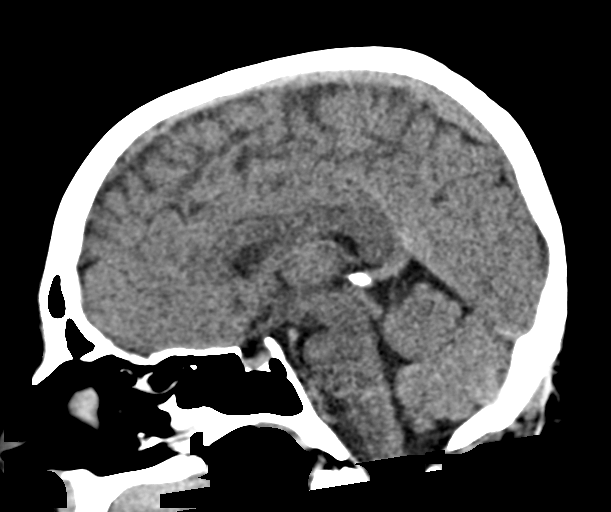
[im 37/56  brain]
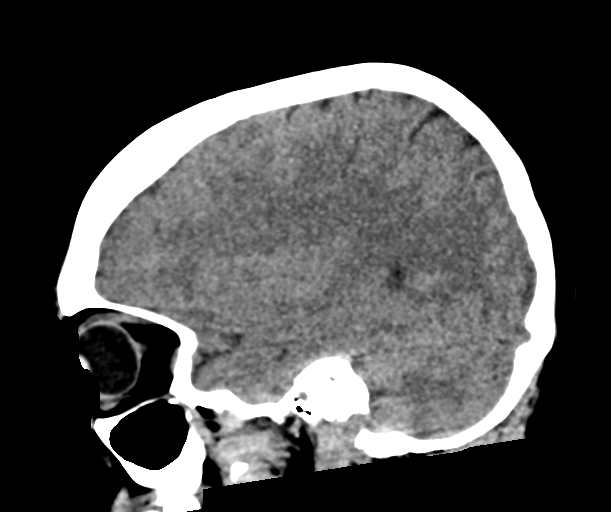

[17 of 47 positions shown; findings below may reference images not displayed]

FINDINGS: Brain: No evidence of acute infarction, hemorrhage, hydrocephalus,
extra-axial collection or mass lesion/mass effect.

Vascular: No hyperdense vessel or unexpected calcification.

Skull: Normal. Negative for fracture or focal lesion.

Sinuses/Orbits: No acute finding.

Other: None.
IMPRESSION: No acute intracranial pathology. No non-contrast CT findings to
explain headache.

## 2022-11-19 ENCOUNTER — Encounter: Payer: Self-pay | Admitting: Family Medicine

## 2022-11-19 ENCOUNTER — Ambulatory Visit (INDEPENDENT_AMBULATORY_CARE_PROVIDER_SITE_OTHER): Payer: BC Managed Care – PPO | Admitting: Family Medicine

## 2022-11-19 VITALS — BP 118/80 | HR 92 | Temp 98.2°F | Ht 62.0 in | Wt 164.5 lb

## 2022-11-19 DIAGNOSIS — R41840 Attention and concentration deficit: Secondary | ICD-10-CM | POA: Diagnosis not present

## 2022-11-19 MED ORDER — AMPHETAMINE-DEXTROAMPHET ER 20 MG PO CP24: 20.0000 mg | ORAL_CAPSULE | Freq: Every day | ORAL | 0 refills | Status: DC

## 2022-11-19 NOTE — Progress Notes (Signed)
Chief Complaint  Patient presents with   Follow-up    Medication     Colleen [name] is 36 y.o. female here for ADHD follow up.  Patient is currently on Adderall XR 20 mg/d and compliance is excellent. Symptoms are well controlled.  Side effects include sweating, decreased appetite, slight insomnia, increased headaches.  Patient believes their dose should be not significantly changed. Denies tics, weight loss, difficulties with sleep, self-medication, alcohol/drug abuse, chest pain, or palpitations.   Past Medical History:  Diagnosis Date   Arthritis    Migraine     BP 118/80 (BP Location: Left Arm, Patient Position: Sitting, Cuff Size: Normal)   Pulse 92   Temp 98.2 F (36.8 C) (Oral)   Ht 5\' 2"  (1.575 m)   Wt 164 lb 8 oz (74.6 kg)   SpO2 99%   BMI 30.09 kg/m  Gen- awake, alert, appearing stated age Heart- RRR Lungs- CTAB, no accessory muscle use Neuro- no facial tics, patellar DTR's 2+ b/l w/o clonus Psych- age appropriate judgment and insight, normal mood and affect  Inattention - Plan: amphetamine-dextroamphetamine (ADDERALL XR) 20 MG 24 hr capsule, amphetamine-dextroamphetamine (ADDERALL XR) 20 MG 24 hr capsule, amphetamine-dextroamphetamine (ADDERALL XR) 20 MG 24 hr capsule  Adverse effect of med. Will cont Adderall XR 20 mg/d. Add Magnesium 200 mg daily. Cont Maxalt 10 mg qd prn.  F/u as originally scheduled.  Pt voiced understanding and agreement to the plan.  [place], DO 11/19/22 8:37 AM

## 2023-02-04 ENCOUNTER — Ambulatory Visit: Payer: BC Managed Care – PPO | Admitting: Psychology

## 2023-02-04 DIAGNOSIS — F89 Unspecified disorder of psychological development: Secondary | ICD-10-CM | POA: Diagnosis not present

## 2023-02-04 NOTE — Progress Notes (Addendum)
Date: 02/04/2023 Appointment Start Time: 10:25am (patient arrived late to the appointment as she had started driving to this evaluator's office due to concerns the appointment was in-person instead of virtual) Duration: 90 minutes Provider: Clarice Pole, PsyD Type of Session: Initial Appointment for Evaluation  Location of Patient: Home Location of Provider: Provider's Home (private office) Type of Contact: WebEx video visit with audio  Session Content:  Prior to proceeding with today's appointment, two pieces of identifying information were obtained from Shilee to verify identity. In addition, Keirsten's physical location at the time of this appointment was obtained. In the event of technical difficulties, Monseratt shared a phone number she could be reached at. Mar[place] and this provider participated in today's telepsychological service. Dashea denied anyone else being present in the room or on the virtual appointment.  The provider's role was explained to Jacqueleen. The provider reviewed and discussed issues of confidentiality, privacy, and limits therein (e.g., reporting obligations). In addition to verbal informed consent, written informed consent for psychological services was obtained from [place] prior to the initial appointment. Written consent included information concerning the practice, financial arrangements, and confidentiality and patients' rights. Since the clinic is not a 24/7 crisis center, mental health emergency resources were shared, and the provider explained e-mail, voicemail, and/or other messaging systems should be utilized only for non-emergency reasons. This provider also explained that information obtained during appointments will be placed in their electronic medical record in a confidential manner. Malaika verbally acknowledged understanding of the aforementioned and agreed to use mental health emergency resources discussed if needed. Moreover, Safiya agreed information may be shared with  other [place] or their referring provider(s) as needed for coordination of care. By signing the new patient documents, Lalanya provided written consent for coordination of care. Jeydi verbally acknowledged understanding she is ultimately responsible for understanding her insurance benefits as it relates to reimbursement of telepsychological and in-person services. This provider also reviewed confidentiality, as it relates to telepsychological services, as well as the rationale for telepsychological services. This provider further explained that video should not be captured, photos should not be taken, nor should testing stimuli be copied or recorded as it would be a copyright violation. Elizabella expressed understanding of the aforementioned, and verbally consented to proceed.  Alphia completed the psychiatric diagnostic evaluation (history, including past, family, social, and  psychiatric history; behavioral observations; and establishment of a provisional diagnosis). The evaluation was completed in 90 minutes. Code [id number] was billed.   Mental Status Examination:  Appearance:  neat Behavior: appropriate to circumstances Mood: anxious Affect: mood congruent Speech: pressured Eye Contact: appropriate Psychomotor Activity: restless Thought Process: linear, logical, and goal directed and denies suicidal, homicidal, and self-harm ideation, plan and intent Content/Perceptual Disturbances: none Orientation: AAOx4 Cognition/Sensorium: intact Insight: good Judgment: good  Provisional DSM-5 diagnosis(es):  F89 Unspecified Disorder of Psychological Development   Plan: Testing is expected to answer the question, does the individual meet criteria for ADHD when age, other mental health concerns (e.g., depression and anxiety), and cognitive functioning are taken into consideration? Further testing is warranted because a diagnosis cannot be given solely based on current interview data (further data is  required). Testing results are expected to answer the remaining diagnostic questions in order to provide an accurate diagnosis and assist in treatment planning with an expectation of improved clinical outcome. Amairani is currently scheduled for an appointment on 02/11/2023 at [facility] via WebEx video visit with audio.   CONFIDENTIAL PSYCHOLOGICAL EVALUATION ______________________________________________________________________________  Name: Hetty Blend  Date of Birth: 02-17-1986    Age: 69  SOURCE AND REASON FOR REFERRAL: Ms. Tabithia Oguinn was referred by Dr. Riki Sheer for an evaluation to ascertain if she meets criteria for Attention Deficit/Hyperactivity Disorder (ADHD).     BACKGROUND INFORMATION AND PRESENTING PROBLEM: Ms. Giulliana Mcclenny is a 37 year old female who resides in [place]  Ms. Munda shared she was referred by Dr. Riki Sheer for "ADHD testing," adding he also prescribed Adderall. She described her ADHD-related concerns as occurring since childhood and including regular forgetfulness (e.g., appointments, to pay bills, picking her son up from school, groceries, tasks, and misplacing of items); being easily distracted by a variety of stimuli (e.g., social media, her phone, other tasks, people talking, and "noises"); periods of hyperfocus that include "spending too much time on tasks" and dyschronometria ,which contributes to her being "late [to events] every day, every time;" task initiation (e.g., often procrastinating and having a hard time determining "where to start" on tasks), maintenance (e.g., becoming distracted by other tasks which leads to her "jumping around from thing to thing"), and disengagement (e.g., strong urge to finish certain tasks which contributes to her being late to events) issues; commonly missing small details and making careless mistakes (e.g., "small typos," missing details of a movie or book's story, and having the wrong date on an  appointment), noting her parents previously commented on these issues while she was in school; trouble sticking to created organization systems which causes clutter in her environment despite a desire for organization and feeling "overwhelmed" and "anxious" when there is clutter in her environment; difficulty following through on plans, especially if they only involve her, that she indicated is a result of low motivation to follow through on them when the planned time arrives; trouble sustaining her attention while others are speaking to her which leads to her missing details of what they have said, adding how efforts to remember what she wants to say also interferes with her ability to listen to their messages; frequent urges to interrupt others that occur if something is said that she is "interested in," she feels she has something relevant to add, and/or is concerned she will forget what she wants to say; habitual fidgeting (e.g., leg bouncing, hair twirling, tapping her fingers together, doodling, and readjusting her sitting position) and difficulty relaxing as she has a "hard time winding down;" often has urges to excessive talk that she "tries not to act on;" alternating between indecisiveness and "impulse purchases" that contribute to difficulty saving; driving approximately 27mh over the speed limit and "maybe a little more than that" on the highway, which has resulted in two speeding tickets; often not reading the instructions prior to tasks which can cause mistakes to be made; and experiencing irritability or feeling overwhelmed if "too much going on at one time" or "things pile up," she is interrupted from a task, or multiple people are talking to her at one time. She also described a history of postpartum depression that started after her second child, adding she utilized counselling at that time and continues to take Zoloft as well as a "possible depressive episode" in college; generalized anxiety that  contribute to various physical concerns (e.g., "stomach aches" and reduced appetite with no known organic causes) and can be difficult to control; sleep onset issues (e.g., having "too many thoughts" which causes anger and further restlessness), tossing and turning while trying to fall asleep as well as in her sleep, and commonly not feeling rested upon  waking. She expressed a belief her ADHD-related concerns are consistent and independent of mood, but can be exacerbated by changes to her routine. She stated her coping strategies include using visual reminders for tasks, grounding, and using audiobooks to provide stimulation during mindless tasks.   Ms. Shumate denied awareness of having experienced developmental milestone delays, grade retention, or learning disability diagnosis. She reported having strong academic abilities, sharing examples of obtaining "straight As" until graduate school, as well as being a "great test taker" and her high school valedictorian; however, she noted how she would often "calculate how much [she] could not do and still get an A," "would underperform" as a result of missing details on assignments and tests despite having the required knowledge, was prone to "daydreaming" during class, and regularly "doodled or colored" as a way to assist with sustaining attention. She discussed having obtained a master's degree in theological studies, stating she "loved it" but struggled with the reduced structure as it contributed to a "lack of motivation," trouble prioritizing assignments, and an overall negative impact on her academic performance. She denied having a history of employment disciplinary actions, although shared how reduced structure in her current position has led to issues similar to what she experienced while in graduate school.   Ms. Somerset described her medical history as significant for rheumatoid arthritis that is "very well managed" and that she "do[es] not notice it 90% of  the time." She reported use of a 12oz coffee and 12oz tea or soda daily, noting if she drinks more than this amount, she feels "jittery." She also reported infrequently utilizing one-to-two standard size drinks of alcohol. She denied use of all other recreational and illicit substances. She denied have ever experienced psychiatric hospitalization; seizures or head injury; hypomanic or manic episode; psychosis; trauma- and stressor-disorder symptomatology; suicidal or homicidal ideation, plan, or intent; or legal involvement. She shared her familial mental health history is significant for "pretty severe anxiety issues" (grandfather), possible depression (grandparents), early onset dementia (paternal grandmother), Huntington's Disease (great grandfather), possible dementia (grandfather), and her sister possibly having been diagnosed with ADHD as teachers and a medical provider "wanted to put her on Ritalin but [their] parents were against it."  Chart Review On an 10/20/2022 appointment note, Dr. Riki Sheer reported "Since [Ms. 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 Sprenkle's sister completed an ADHD evaluation but "never went on [ADHD-related medication." He also reported Ms. Rarick was interested in completing an ADHD evaluation and "starting medication."               Dolores Lory, PsyD

## 2023-02-11 ENCOUNTER — Ambulatory Visit: Payer: BC Managed Care – PPO | Admitting: Psychology

## 2023-02-11 DIAGNOSIS — F89 Unspecified disorder of psychological development: Secondary | ICD-10-CM

## 2023-02-11 NOTE — Progress Notes (Signed)
Date: 02/11/2023   Appointment Start Time: 9:02am Duration: 75 minutes Provider: Clarice Pole, PsyD Type of Session: Testing Appointment for Evaluation  Location of Patient: Home Location of Provider: Provider's Home (private office) Type of Contact: WebEx video visit with audio  Session Content: Today's appointment was a telepsychological visit due to COVID-19. Rendi is aware it is her responsibility to secure confidentiality on her end of the session. Prior to proceeding with today's appointment, Klohe's physical location at the time of this appointment was obtained as well a phone number she could be reached at in the event of technical difficulties. Kimberley denied anyone else being present in the room or on the virtual appointment. This provider reviewed that video should not be captured, photos should not be taken, nor should testing stimuli be copied or recorded as it would be a copyright violation. Aronda expressed understanding of the aforementioned, and verbally consented to proceed. The WAIS-IV was administered, scored, and interpreted by this evaluator  Billing codes will be input on the feedback appointment. There are no billing codes for the testing appointment.   Provisional DSM-5 diagnosis(es):  F89 Unspecified Disorder of Psychological Development   Plan: Ramla was scheduled for a feedback appointment on 02/18/2023 at 11am via WebEx video visit with audio.                Dolores Lory, PsyD

## 2023-02-18 ENCOUNTER — Ambulatory Visit (INDEPENDENT_AMBULATORY_CARE_PROVIDER_SITE_OTHER): Payer: BC Managed Care – PPO | Admitting: Psychology

## 2023-02-18 DIAGNOSIS — F900 Attention-deficit hyperactivity disorder, predominantly inattentive type: Secondary | ICD-10-CM

## 2023-02-18 NOTE — Progress Notes (Signed)
Testing and Report Writing Information: The following measures  were administered, scored, and interpreted by this provider:  Generalized Anxiety Disorder-7 (GAD-7; 5 minutes), Patient Health Questionnaire-9 (PHQ-9; 5 minutes), Wechsler Adult Intelligence Scale-Fourth Edition (WAIS-IV; 70 minutes), CNS Vital Signs (45 minutes), Adult Attention Deficit/Hyperactivity Disorder Self-Report Scale Checklist (ASRSv1.1; 15 minutes), Behavior Rating Inventory for Executive Function - A - Self Report (BRIEF A; 10 minutes) and Behavior Rating Inventory for Executive Function - A - Informant  (BRIEF-A; 10 minutes) , Personality Assessment Inventory (PAI; 50 minutes). A total of 210 minutes was spent on the administration and scoring of the aforementioned measures. Codes [id number] and [phone] (6 units) were billed.  Please see the assessment for additional details. This provider completed the written report which includes integration of patient data, interpretation of standardized test results, interpretation of clinical data, review of information provided by Briona and any collateral information/documentation, and clinical decision making (235 minutes in total).  Feedback Appointment: Date: 02/18/2023 Appointment Start Time: 11am Duration: 45 minutes Provider: Clarice Pole, PsyD Type of Session: Feedback Appointment for Evaluation  Location of Patient: Home Location of Provider: Provider's Home (private office) Type of Contact: WebEx video visit with audio  Session Content: Today's appointment was a telepsychological visit due to COVID-19. Colleen [name] is aware it is her responsibility to secure confidentiality on her end of the session. She provided verbal consent to proceed with today's appointment. Prior to proceeding with today's appointment, Colleen [name]'s physical location at the time of this appointment was obtained as well a phone number she could be reached at in the event of technical difficulties. Colleen [name] denied anyone  else being present in the room or on the virtual appointment.  This provider and Colleen [name] completed the interactive feedback session which includes reviewing the aforementioned measures, treatment recommendations, and diagnostic conclusions.   The interactive feedback session was completed today and a total of 45 minutes was spent on feedback. Code [phone] was billed for feedback session.   DSM-5 Diagnosis(es):  F90.0 Attention-Deficit/Hyperactivity Disorder, Primarily Inattentive Presentation, Moderate  Time Requirements: Assessment scoring and interpreting: 210 minutes (billing code [id number] and [phone] [6 units]) Feedback: 45 minutes (billing code [phone]) Report writing: 235 total minutes. 02/06/2023: 5:50-6:30pm and 8:35-9:10pm. 02/07/2023: 8:05-8:20am. 02/08/2023: 8:20-8:30am. 02/11/2023: 10:40-11am and 2:10-2:50pm. 02/12/2023: 10:50-11:35am. 02/14/2023: 8:10-8:30pm. 02/15/2023: 1:30-1:35pm and 1:55-2:00pm.  (billing code [id number] [4 units])  Plan: Colleen [name] provided verbal consent for her evaluation to be sent via e-mail. No further follow-up planned by this provider.         CONFIDENTIAL PSYCHOLOGICAL EVALUATION ______________________________________________________________________________  Name: Colleen [name]   Date of Birth: [birth year]    Age: 48 Dates of Evaluation: 02/04/2023, 02/05/2023, 02/07/2023, 3/14/20and 02/11/2023  SOURCE AND REASON FOR REFERRAL: Colleen [name]. Colleen [name] was referred by Dr. Riki Sheer for an evaluation to ascertain if she meets criteria for Attention Deficit/Hyperactivity Disorder (ADHD).   EVALUATIVE PROCEDURES: Clinical Interview with Colleen [name]. Colleen [name] (02/04/2023) Wechsler Adult Intelligence Scale-Fourth Edition (WAIS-IV; 02/11/2023) CNS Vital Signs (02/07/2023) Adult Attention Deficit/Hyperactivity Disorder Self-Report Scale Checklist (02/07/2023) Behavior Rating Inventory for Executive Function - A - Self Report Behavior Rating Inventory for Executive Function - A - Self  Report (BRIEF-A; 02/05/2023) and Informant (02/10/2023) Personality Assessment Inventory (PAI; 02/05/2023) Patient Health Questionnaire-9 (PHQ-9) Generalized Anxiety Disorder-7 (GAD-7)   BACKGROUND INFORMATION AND PRESENTING PROBLEM: Colleen [name] is a 37 year old female who resides in [place].  Colleen [name] shared she was referred by Dr. Riki Sheer for "ADHD testing," adding he also prescribed Adderall. She described her ADHD-related concerns  as occurring since childhood and including regular forgetfulness (e.g., appointments, paying bills, picking her son up from school, groceries, tasks, and misplacing of items); being easily distracted by a variety of stimuli (e.g., social media, her phone, other tasks, people talking, and "noises"); periods of hyperfocus that include "spending too much time on tasks" and dyschronometria ,which contributes to her being "late [to events] every day, every time;" task initiation (e.g., often procrastinating and having a hard time determining "where to start" on tasks), maintenance (e.g., becoming distracted by other tasks which leads to her "jumping around from thing to thing"), and disengagement (e.g., strong urge to finish certain tasks which contributes to her being late to events) issues; commonly missing small details and making careless mistakes (e.g., "small typos," missing details of a movie or book's story, and having the wrong date for an appointment), noting her parents previously commented on these issues while she was in school; trouble sticking to created organization systems which causes clutter in her environment despite a desire for organization and feeling "overwhelmed" and "anxious" when there is clutter in her environment; difficulty following through on plans, especially if they only involve her, that she indicated is a result of low motivation to follow through on them when the planned time arrives; trouble sustaining her attention while  others are speaking to her which leads to her missing details of what they have said, adding efforts to remember what she wants to say also interferes with her ability to listen to their messages; frequent urges to interrupt others that occur if something is said that she is "interested in," she feels she has something relevant to add, and/or is concerned she will forget what she wants to say; habitual fidgeting (e.g., leg bouncing, hair twirling, tapping her fingers together, doodling, and readjusting her sitting position) and difficulty relaxing as she has a "hard time winding down;" often has urges to excessively talk that she "tr[ies] not to act on;" alternating between indecisiveness and "impulse purchases" that contribute to difficulty saving; driving approximately 55mph over the speed limit and "maybe a little more than that" on the highway resulting in two speeding tickets; often not reading the instructions prior to tasks which can cause mistakes to be made; and experiencing irritability or feeling overwhelmed if "too much is going on at one time" or "things pile up," she is interrupted from a task, or multiple people are talking to her at one time. She also described a history of postpartum depression that started after her second child, adding she attended counseling at that time and continues to take Zoloft. Additionally, she noted experiencing a "possible depressive episode" in college; generalized anxiety that contribute to various physical concerns (e.g., "stomach aches" and reduced appetite with no known organic causes) and can be difficult to control; sleep onset issues (e.g., having "too many thoughts" which causes anger and further restlessness), tossing and turning while trying to fall asleep as well as in her sleep, and commonly not feeling rested upon waking. Colleen [name] expressed a belief her ADHD-related concerns are consistent and independent of mood, but can be exacerbated by changes to her  routine. She stated her coping strategies include using visual reminders for tasks, grounding, and listening to audiobooks to provide stimulation during mindless tasks.   Colleen [name] denied a history of developmental milestone delays, grade retention, or learning disability diagnosis. She reported having strong academic abilities, and shared examples of obtaining "straight As" until graduate school, adding she was a "great test taker"  and was her high school valedictorian; however, she noted she would often "calculate how much [she] could not do and still get an A," "would underperform" as a result of missing details on assignments and tests despite having the required knowledge, was prone to "daydreaming" during class, and regularly "doodled or colored" as a way to assist with sustaining attention. She stated she obtained a master's degree in theological studies, noting she "loved it" but struggled with the reduced structure as it contributed to a "lack of motivation," trouble prioritizing assignments, and an overall negative impact on her academic performance. Colleen [name] denied a history of employment disciplinary actions, although shared reduced structure in her current position has led to issues similar to what she experienced while in graduate school.   Colleen [name] described her medical history as significant for rheumatoid arthritis that is "very well managed," noting she "do[es] not notice it 90% of the time." She reported use of a 12oz coffee and 12oz tea or soda daily, adding if she drinks more than this amount, she feels "jittery." She also reported infrequently consuming one-to-two standard size drinks of alcohol. She denied use of all other recreational and illicit substances. Colleen [name] denied a history of psychiatric hospitalization; seizures or head injury; hypomanic or manic episode; psychosis; trauma- and stressor-disorder symptomatology; suicidal or homicidal ideation, plan, or intent; or legal  involvement. She shared her familial mental health history is significant for "pretty severe anxiety issues (grandfather)," possible depression (grandparents), early onset dementia (paternal grandmother), Huntington's Disease (great grandfather), possible dementia (grandfather), and her sister possibly having been diagnosed with ADHD as teachers and a medical provider "wanted to put her on Ritalin but [their] parents were against it."  Chart Review: Per an appointment note dated 10/20/2022, Dr. Riki Sheer noted the following: "Since [Colleen [name]. 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 Ladnier's sister completed an ADHD evaluation but "never went on [ADHD-related medication]." He also reported Colleen [name]. Ozier was interested in completing an ADHD evaluation and "starting medication."  BEHAVIORAL OBSERVATIONS: Colleen [name] presented on time for the evaluation. She was well-groomed. She was oriented to time, place, person, and purpose of the appointment. During the evaluation she verbalized and/or demonstrated doubt (e.g., commonly stating "I think" or "I don't know" during and/or after providing an answer), self-expression difficulties (e.g., providing lengthy answers to verbal comprehension tasks, abruptly changing her answer or word choices, and occasional word finding problems), and working memory-related issues (e.g., using her fingers to track verbally provided information, laughing or grimacing when perceiving a task as difficult or having difficult providing an answer, and indicating she lost track of the provided digits while attempting to manipulate them into the requested order). Throughout the course of the evaluation, she maintained appropriate eye contact. Her thought processes and content were logical, coherent, and goal-directed. There was no evidence of paraphasias (i.e., errors in  speech, gross mispronunciations, and word substitutions), repetition deficits, or disturbances in volume or prosody (i.e., rhythm and intonation). Overall, based on Colleen [name]'s approach to testing, the current results are believed to be a good estimate of her abilities.  PROCEDURAL CONSIDERATIONS:  Psychological testing measures were conducted through a virtual visit with video and audio capabilities, but otherwise in a standard manner.   The Wechsler Adult Intelligence Scale, Fourth Edition (WAIS-IV) was administered via remote telepractice using digital stimulus materials on [employer]'s Q-global system.  The remote testing environment appeared free of distractions, adequate rapport was established with the examinee via video/audio capabilities, and Mr. Heckendorf appeared appropriately engaged in the task throughout the session. No significant technological problems were noted during administration, although Colleen [name]. Popiel noted being distracted by noises during a task on Digit Span Sequencing that she answered erroneously. She provided correct answers on Digit Span Sequencing tasks with more digits so it appears possible the distraction negatively impacted her performance on that task. Modifications to the standardization procedure included: none. The WAIS-IV subtests, or similar tasks, have received initial validation in several samples for remote telepractice and digital format administration, and the results are considered a valid description of Colleen [name]. Quesada's skills and abilities.  CLINICAL FINDINGS:  COGNITIVE FUNCTIONING  Wechsler Adult Intelligence Scale, Fourth Edition (WAIS-IV): Colleen [name] completed subtests of the WAIS-IV, a full-scale measure of cognitive ability. She completed subtests of the WAIS-IV, a full-scale measure of cognitive ability. The WAIS-IV is comprised of four indices that measure cognitive processes that are components of intellectual ability; however, only subtests from the Verbal  Comprehension and Working Memory indices were administered. As a result, Full-Scale-IQ (FSIQ) and General Ability Index (GAI) were unable to be determined.   WAIS-IV Scale/Subtest IQ/Scaled Score 95% Confidence Interval Percentile Rank Qualitative Description  Verbal Comprehension (VCI) 143 136-147 99.8 Very Superior  Similarities 19     Vocabulary 15     Information 17     Working Memory (WMI) 133 124-138 99 Very Superior  Digit Span 17     Arithmetic 15       The Verbal Comprehension Index (VCI) provides a measure of one's ability to receive, comprehend, and express language. It also measures the ability to retrieve previously learned information and to understand relationships between words and concepts presented orally. Colleen [name] obtained a VCI scaled score of 143 (99.8th percentile) placing her in the very superior range compared to same-aged peers. Her performance on the subtests comprising this index was diverse. Out of the three subtests, Colleen [name] demonstrated the strongest performance on the Similarities subtest, which measured her ability to abstract meaningful concepts and relationships from verbally presented material. Her lowest performance was on the Vocabulary subtest, which required her to explain the meaning of words presented in isolation. Additionally, performance on this subtest requires abilities to verbalize meaningful concepts, as well as retrieve information from long-term memory.     The Working Memory Index ([place]) provides a measure of one's ability to sustain attention, concentrate, and exert mental control. Colleen [name] obtained a WMI scaled score of 133 (99th percentile), placing her in the very superior range compared to same-aged peers. The 10-point difference between the VCI and WMI scores is statistically significant at the .05 level, which suggests her ability to sustain attention, concentrate, and exert mental control is a relative weakness compared to her verbal  reasoning abilities.   ATTENTION AND PROCESSING  CNS Vital Signs: The CNS Vital Signs assessment evaluates the neurocognitive status of an individual and covers a range of mental processes. The results of the CNS Vital Signs testing indicated above neurocognitive processing ability. Regarding attentional abilities, her simple attention, complex attention, and sustained attention score were similar, although ranged from the average-above. Cognitive flexibility and executive function were relative weaknesses (20 points lower than her NCI standard score) in the average range. Working memory was average, although approached the above range. Psychomotor speed, motor speed, and processing speed were above, which indicates strong hand-eye coordination, responsiveness, and thinking  speed. Reaction time was average. Visual memory (images) was above and verbal memory (words) was average, which indicates visual memory is a relative strength. The results suggest Colleen [name] experiences relative weaknesses in executive functioning and cognitive flexibility. She demonstrated strengths in visual memory, psychomotor speed, processing speed, sustained attention, and motor speed and relative weaknesses in cognitive flexibility and executive function.   Domain  Standard Score Percentile Validity Indicator Guideline  Neurocognitive Index 110 75 Yes Above  Composite Memory 125 95 Yes Above  Verbal Memory 106 66 Yes Average  Visual Memory 136 99 Yes Above  Psychomotor Speed 126 96 Yes Above  Reaction Time 106 66 Yes Average  Complex Attention 102 55 Yes Average  Cognitive Flexibility 90 25 Yes Average  Processing Speed  127 96 Yes Above  Executive Function 90 25 Yes Average  Working Memory 109 73 Yes Average  Sustained Attention 112 79 Yes Above  Simple Attention 107 68 Yes Average  Motor Speed 118 88 Yes Above   EXECUTIVE FUNCTION  Behavior Rating Inventory of Executive Function, Second Edition (BRIEF-A)  Self-Report:  Colleen [name] completed the Self-Report Form of the Behavior Rating Inventory of Executive Function-Adult Version (BRIEF-A), which has three domains that evaluate cognitive, behavioral, and emotional regulation, and a Global Executive Composite score provides an overall snapshot of executive functioning. There are no missing item responses in the protocol. The Negativity, Infrequency, and Inconsistency scales are not elevated, suggesting she did not respond to the protocol in an overly negative, haphazard, extreme, or inconsistent manner. In the context of these validity considerations, the ratings of Colleen [name]. Recendiz's everyday executive function suggest some areas of concern. The overall index, the Global Executive Composite (GEC), was elevated (GEC T = 76, %ile = 98). The Behavioral Regulation Index (BRI) was within normal limits (BRI T = 61, %ile = 87) and the Metacognition Index (MI) was elevated (MI T = 84, %ile = 99). Colleen [name]. Koder indicated difficultly with her ability to adjust to changes in routine or task demands, initiate problem solving or activity, sustain working memory, plan and organize problem-solving approaches, attend to task-oriented output, and organize environment and materials. She did not describe her ability to inhibit impulsive responses, modulate emotions, and monitor social behavior as problematic, although the inhibit and emotional control scales approached an abnormal elevation.   Scale/Index  Raw Score T Score Percentile  Inhibit 15 61 92  Shift 12 65 93  Emotional Control 20 62 89  Self-Monitor 8 46 52  Behavioral Regulation Index (BRI) 55 61 87  Initiate 22 84 >99  Working Memory 20 81 99  Plan/Organize 21 69 98  Task Monitor 16 83 >99  Organization of Materials 23 80 >99  Metacognition Index (MI) 102 84 99  Global Executive Composite (GEC) 157 76 98   Validity Scale Raw Score Cumulative Percentile Protocol Classification  Negativity 2 0 - 98.3 Acceptable   Infrequency 0 0 - 97.3 Acceptable  Inconsistency 3 0 - 99.2 Acceptable   Behavior Rating Inventory of Executive Function, Second Edition (BRIEF-A) Informant:  Colleen [name]. Mcgourty's spouse, Mr. Elmer Alessi, completed the Informant Form of the Behavior Rating Inventory of Executive Function-Adult Version (BRIEF-A), which is equivalent to the Self-Report version and has three domains that evaluate cognitive, behavioral, and emotional regulation, and a Global Executive Composite score provides an overall snapshot of executive functioning. There are no missing item responses in the protocol. The Negativity, Infrequency, and Inconsistency scales are not elevated, suggesting he did not respond to the  protocol in an overly negative, haphazard, extreme, or inconsistent manner. In the context of these validity considerations, Mr. Badders ratings of Colleen [name]. Strawderman's everyday executive function suggest some areas of concern. The overall index, the Global Executive Composite (GEC), was within the non-elevated range for age ([place] T = 59, %ile = 81). The Behavioral Regulation Index (BRI) was within normal limits (BRI T = 47, %ile = 50) and the Metacognition Index (MI) was elevated (MI T = 66, %ile = 93). Mr. Buzby indicated Colleen [name]. Degollado experiences difficultly with her ability to initiate problem solving or activity, sustain working memory, and Tour manager. Mr. Filipovic did not describe her ability to inhibit impulsive responses, adjust to changes in routine or task demands, modulate emotions, monitor social behavior, plan and organize problem-solving approaches, and attend to task-oriented output as problematic, although the plan/organize scale approached an abnormal elevation. Upon follow-up, Colleen [name]. Petterson stated she was not surprised to learn Mr. Learned perceived her as experiencing less issues in various executive functioning abilities than she perceives herself as experiencing, noting she often does not outwardly  demonstrate various executive functioning difficulties (e.g., generally does not have emotional outbursts while around others but regularly "internalizes" strong emotional reactions that can come on quickly).   Scale/Index  Raw Score T Score Percentile  Inhibit 13 55 79  Shift 10 54 73  Emotional Control 10 39 18  Self-Monitor 8 46 49  Behavioral Regulation Index (BRI) 41 47 50  Initiate 17 65 91  Working Memory 16 66 91  Plan/Organize 20 63 89  Task Monitor 11 59 85  Organization of Materials 20 67 95  Metacognition Index (MI) 84 66 93  Global Executive Composite (GEC) 125 58 81   Validity Scale Raw Score Cumulative Percentile Protocol Classification  Negativity 0 0 - 98.5 Acceptable  Infrequency 0 0 - 93.3 Acceptable  Inconsistency 4 0 - 98.8 Acceptable  BEHAVIORAL FUNCTIONING   Patient Health Questionnaire-9 (PHQ-9): Colleen [name]. Rezendes completed the PHQ-9, a self-report measure that assesses symptoms of depression. She scored 11/27, which indicates moderate depression.   Generalized Anxiety Disorder-7 (GAD-7): Colleen [name]. Pettaway completed the GAD-7, a self-report measure that assesses symptoms of anxiety. She scored 6/21, which indicates mild anxiety.   Adult ADHD Self-Report Scale Symptom Checklist (ASRS): Colleen [name]. Righter reported the following symptoms as sometimes: difficulty wrapping up final details of a project following the completion of challenging aspects, difficulty getting things in order when a task requires organization, feeling restless or fidgety, difficulty relaxing, talking too much in social situations, and interrupting others when they are busy. She endorsed the following symptoms as occurring often: problems remembering appointments or obligations, making careless mistakes when working on boring or difficult projects, struggling to concentrate on what people say even when they are speaking directly to her, misplacing or has difficulty finding things, and interrupting others or finishing  their sentences. She endorsed the following symptoms as very often: avoiding or delaying getting started on tasks requiring a lot of thought, fidgeting or squirming, struggling to sustain attention when doing boring or repetitive work, and being distracted by noise around her. Endorsement of at least four items in Part A is highly consistent with ADHD in adults. The frequency scores of Part B provides additional cues. Colleen [name]. Campion scored a 5/6 on Part A and 7/12 on Part B, which is considered a positive screening for ADHD.   Personality Assessment Inventory (PAI): The PAI is an objective inventory of adult personality. The validity indicators suggested Colleen [name].  Benevides's profile is interpretable (ICN T = 49, INF T = 44, NIM T = 44, and PIM T = 52). She is endorsing frequent occurrence of various common physical symptoms and vague complaints of ill health and fatigue (SOM-S T = 65) as well as physical signs of tension (e.g., trembling hands, sweaty palms, and/or shortness of breath; ANX-P T = 61) and vegetative signs of depression (e.g., sleep issues, appetite problems, and/or fatigue; DEP-P T = 65) and an activity level that is somewhat higher than normal (MAN-A T = 60). She also described herself as being warm and sympathetic (WRM T = 67); having close, generally supportive relationships with friends and family (NON T = 37); and being generally satisfied with herself and seeing little need for major changes in behavior (RXR T = 55).    SUMMARY AND CLINICAL IMPRESSIONS: Colleen [name]. Ahmirah Whiteley is a 37 year old female who was referred by Dr. Riki Sheer for an evaluation to determine if she currently meets criteria for a diagnosis of Attention-Deficit/Hyperactivity Disorder (ADHD).   Colleen [name]. Prizzi reported she was prescribed Adderall and referred for an ADHD evaluation by Dr. Nani Ravens, noting a history of ADHD-related concerns that have occurred since childhood. She also reported current use of Zoloft, as well as a  history of "possible depressive episode in college," postpartum depression, generalized anxiety, sleep issues. She expressed a belief her ADHD-related concerns are consistent and independent of mood, but can be exacerbated by changes to her routine.  During the evaluation, Colleen [name]. Rickles was administered assessments to measure her current cognitive abilities. Her verbal comprehension abilities were in the very superior range, and she demonstrated the strongest performance on the Similarities subtest, which measured her ability to abstract meaningful concepts and relationships from verbally presented material. Her lowest performance was on the Vocabulary subtest, which required her to explain the meaning of words presented in isolation. Additionally, performance on this subtest requires abilities to verbalize meaningful concepts, as well as retrieve information from long-term memory. Her ability to sustain attention, concentrate, and exert mental control was also in the very superior range, although was 10 points lower than her VCI score. This suggests her ability to sustain attention, concentrate, and exert mental control is a relative weakness compared to her verbal reasoning abilities. Results of the CNS Vital Signs indicated an above neurocognitive processing ability. Colleen [name]. Mendlowitz demonstrated relative weaknesses in executive functioning and cognitive flexibility, and strengths in visual memory, psychomotor speed, processing speed, sustained attention, and motor speed.  During the clinical interview and on self-report measures, Colleen [name]. Dermody endorsed significant executive functioning concerns that include attentional dysregulation and hyperactivity- and impulsivity-related symptoms. She also endorsed meeting full criteria for ADHD. Moreover, her spouse, Mr. Slavick, indicated she is experiencing executive functioning issues, although at a lower extent than Colleen [name]. Coots perceives herself as experiencing. It is unclear  what caused this discrepancy, although it may be related to Colleen [name]. Maillet's demonstrated cognitive strengths and use of various compensatory strategies that may assist her in reducing the impact of various ADHD-related concerns. Moreover, Colleen [name]. Isa expressed a belief that she often does not outwardly demonstrate various executive functioning issues. When considering self-reported symptoms; endorsed and/or demonstrated relative weaknesses on measures of working memory, executive function, and cognitive flexibility; Dr. Irene Limbo indication of an extended history of fidgeting and being easily distracted; and a reported possible familial history of ADHD, a diagnosis of F90.0 Attention-Deficit/Hyperactivity Disorder, Primarily Inattentive Presentation, Moderate appears warranted. The "Primarily Inattentive Presentation" specifier was given as she indicated meeting full  criteria for the Inattentive criterion but her endorsement of Hyperactivity and Impulsivity criterion-related concerns did not appear to be at a frequency that met full criteria for the criterion. The specifier of "Moderate" was assigned as she indicated her ADHD-related concerns negatively impact her academic (e.g., "underperform[ing]" as a result of missing details on assignments and tests and trouble sustaining her attention during classes), occupational (e.g., difficulty performing in roles with reduced structure), social (e.g. difficulty sustaining her attention during conversations and remembering what she wants to say), and daily (e.g., habitual fidgeting and difficulty relaxing, regular forgetfulness, and being easily distracted) functioning.   Colleen [name]. Rorke also described a history of postpartum depression that started after her second child, adding she attended counseling at that time and continues to take Zoloft. She also reported experiencing a "possible depressive episode" in college, generalized anxiety that contribute to various physical  concerns and can be difficult to control, sleep onset issues, tossing and turning while trying to fall asleep as well as in her sleep, and commonly not feeling rested upon waking. As a result, the PHQ-9, GAD-7, and PAI were administered. Her results indicated moderate depression- and mild anxiety-related symptomatology; however, given the limited scope of this evaluation, it was unable to be determined if full criteria for an anxiety, depressive, or sleep-wake disorder are met or if the symptoms are better explained by ADHD. As such, she would likely benefit from further evaluation of these symptoms to definitively rule in or out the aforementioned concerns. Should any of the aforementioned be ruled in, it would likely be in addition to her diagnosis of ADHD as she indicated her ADHD-related concerns are consistent and independent of mood.   DSM-5 Diagnostic Impressions: F90.0 Attention-Deficit/Hyperactivity Disorder, Primarily Inattentive Presentation, Moderate  RECOMMENDATIONS: Colleen [name]. Berkson would likely benefit from making use of strategies for ADHD symptoms:  Setting a timer to complete tasks. Breaking tasks into manageable chunks and spreading them out over longer periods of time with breaks.  Utilizing lists and day calendars to keep track of tasks.  Answering emails daily.  Improving listening skills by asking the speaker to give information in smaller chunks and asking for explanation for clarification as needed. Leaving more than the anticipated time to complete tasks. It may help to keep tasks brief, well within your attention span, and a mix of both high and low interest tasks. Tasks may be gradually increased in length. Practice proactive planning by setting aside time every evening to plan for the next day (e.g., prepare needed materials or pack the car the night before).  Learn how to make an effective and reasonable "to do" list of important tasks and priorities and always keep it easily  accessible. Make additional copies in case it is lost or misplaced. Utilize visual reminders by posting appointments, "to do lists," or schedule in strategic areas at home and at work.  Practice using an appointment book, smart phone or other tech device, or a daily planning calendar, and learn to write down appointments and commitments immediately. Keep notepads or use a portable audio recorder to capture important ideas that would be beneficial to recall later. Learn and practice time management skills. Purchase a programmable alarm watch or set an alarm on smartphone to avoid losing track of time.  Use a color-coded file system, desk and closet organizers, storage boxes, or other organization device to reduce clutter and improve efficiency and structure.  Implement ways to become more aware of your actions and to inhibit or adjust them as  warranted (e.g., reviewing videos of your actions, consider consequences of obeying or not obeying the rules of various upcoming situations, have a trusted other to discuss plans with and/or provide cues to stop certain behaviors, and make visual cues for rules you would like to follow). Stay flexible and be prepared to change your plans as symptom breakthroughs and crises are likely to occur periodically. Colleen [name]. Buhman may benefit from mindfulness training to address symptoms of inattention.  Colleen [name]. Paone would likely benefit from a consultation regarding medication for ADHD symptoms.   Individual therapeutic services may assist in processing a diagnosis of ADHD and discussing coping and compensatory strategies. Mental alertness/energy can be raised by increasing exercise; improving sleep; eating a healthy diet; and managing stress. Consulting with a physician regarding any changes to physical regimen is recommended. "Failing at Normal: An ADHD Success Story" by Mellody Drown is a great overview of ADHD. Dr. Murlean Hark also has a YouTube channel with helpful videos  on ADHD-related topics: [id number] Applications:  RescueTime. Tracks your activities on phone and/or computer to determine how productive you have been, and what distracted you. Free two week trial.  Focus@Will . Uses engineered audio that human voice-like frequencies. Free 15-day trial. Freedom. Allows you to highlight days and times you want to block yourself from certain sites or apps. Free trial. Mint.  Allows you to input your bank accounts and creates a visual layout of various information about your financial goals, budget management, alerts, etc. Free. Boomerang. Gives you the option to schedule times an email is sent as well as to see if others have received or opened your email. 10 messages free per month and a free trial of premium version. IFTTT. Uses "channels" to create various actions (e.g., if you are mentioned in an email to highlight it in your inbox and if you miss a call to add it to a to-do list). Free and premium versions. Unroll.me. Cleans up your email by unsubscribing from what you do not want to receive while still getting everything you do. Free. Finish. Allows you to divide two-list tasks into short-term, mid-term, and long-term as well as how much time is left for a task. Focus mode hides non-priority tasks.  Autosilent. Turns your phone ringer on and off based on specified calendars, geo-fences, timers, etc. $3.99. Freakyalarm. Makes you solve math problems to disable an alarm. $1.99. Wake N Shake. Makes you vigorously shake your phone to stop the alarm. $.99. Todoist. Allows you to add sub-tasks to tasks as well as includes email and Web plugins to make it work across system. Premium has location-based reminders, calendar sync, productive tracking, etc.  Sleep Cycle. Utilizes your phone's motion sensors to pick up on movement while you are asleep. The alarm will wake you as early as 30 minutes before your alarm based on your  lightest phase of sleep as well as showing you how daily activities affect your sleep quality.  Book: "Taking Charge of Adult ADHD Second Edition" by Dr. Murlean Hark Organizations that are a good source of information on ADHD:  Children and Adults with Attention-Deficit/Hyperactivity Disorder (CHADD): chadd.org  Attention Deficit Disorder Association (ADDA): CondoFactory.com.cy ADD Resources: addresources.org ADD WareHouse: addwarehouse.com World Federation of ADHD: adhd-federation.org ADDConsults: https://www.hines.net/. Compilation of ADHD resources: EquipmentWeekly.com.ee Future evaluation if deemed necessary and/or to determine effectiveness of recommended interventions.   Clarice Pole, Psy.D. Licensed Psychologist - [facility]-P [id number]            Dolores Lory, PsyD

## 2023-03-14 ENCOUNTER — Encounter: Payer: Self-pay | Admitting: Family Medicine

## 2023-03-14 ENCOUNTER — Encounter: Payer: Self-pay | Admitting: *Deleted

## 2023-03-31 DIAGNOSIS — Z130 Encounter for screening for diseases of the blood and blood-forming organs and certain disorders involving the immune mechanism: Secondary | ICD-10-CM | POA: Diagnosis not present

## 2023-03-31 DIAGNOSIS — Z1389 Encounter for screening for other disorder: Secondary | ICD-10-CM | POA: Diagnosis not present

## 2023-03-31 DIAGNOSIS — Z01419 Encounter for gynecological examination (general) (routine) without abnormal findings: Secondary | ICD-10-CM | POA: Diagnosis not present

## 2023-03-31 DIAGNOSIS — Z1331 Encounter for screening for depression: Secondary | ICD-10-CM | POA: Diagnosis not present

## 2023-04-05 NOTE — Progress Notes (Unsigned)
Office Visit Note  Patient: Colleen [name]             Date of Birth: [birth year]           MRN: [id number]             PCP: Sharlene Dory, DO Referring: Sharlene Dory* Visit Date: 04/06/2023   Subjective:  No chief complaint on file.   History of Present Illness: Colleen [name] is a 37 y.o. female here for follow up ***   Previous HPI 10/04/22 [name] is a 37 y.o. female here for follow up for seropositive RA now back on the hydroxychloroquine 300 mg daily.  We tapered off and discontinued this medication earlier in the year and she experienced increasing joint pain and's stiffness starting at about 6 weeks since stopping medicine completely.  Overall doing well again on this medicine.  Currently has some pain at the right wrist this has been ongoing for about 3 weeks.  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.  Overall symptoms have been gradually improving over time pain most commonly provoked with some wrist rotating and bending movements.  She is also noticed some bilateral hip pain that most commonly bothers her at nighttime especially if she lies on either side.  When she falls asleep this is not causing frequent awakenings.  She has been training for a half marathon recently with increased running time.     Previous HPI 03/31/2022 Colleen [name] is a 37 y.o. female here for follow up for RA on HCQ 300 mg daily. Symptoms appeared well controlled at last visit with normal ESR highly positive CCP Abs. She is feeling very well with no daily arthritis symptoms. She estimates taking the HCQ about 75% of days due to forgetting sometimes. Left ankle is improved since last visit.    Previous HPI 11/09/21 Colleen [name] is a 37 y.o. female is here for rheumatoid arthritis currently on hydroxychloroquine 300 mg PO daily.  She was originally diagnosed with this problem about 11 years ago after developing  joint pain and stiffness in bilateral shoulders wrists and hands with work-up positive for CCP antibodies and elevated sedimentation rate.  She initially started treatment on hydroxychloroquine for this with Dr. Cardell Peach and then later seeing Dr. Sharmon Revere for this problem for years. No known evidence of joint erosion or significant deforming changes or extrarticular complications. At present her symptoms are well controlled, a few minutes of stiffness in the morning but not much pain and no swelling. She has noticed a small deviation in distal finger joints. She stopped taking hydroxychloroquine during her first pregnancy and experienced some disease flares requiring prednisone for this in 2017. During her second pregnancy last year she continued medication and did not experience similar problems. She had an episode over this summer with increased symptoms but improved on its own without a change in treatments. She sustained a left ankle sprain injury running treated with immobilization and most mostly recovered from this. She sees her eye doctor in [place] with nearsightedness and hydroxychloroquine toxicity monitoring no problem identified so far, most recent appointment in October was rescheduled. She denies new skin rashes or hyperpigmentation areas. No history of raynaud's symptoms but feels cold a large amount of the time.   No Rheumatology ROS completed.   PMFS History:  Patient Active Problem List   Diagnosis Date Noted   Long-term use of hydroxychloroquine 10/04/2022   Bilateral  hip pain 10/04/2022   Pain in right wrist 10/04/2022   Depression, recurrent (HCC) 11/13/2021   Migraine without aura and without status migrainosus, not intractable 11/13/2021   Rheumatoid arthritis of multiple sites with negative rheumatoid factor (HCC) 09/30/2021   Sprain of anterior talofibular ligament of left ankle 08/06/2021   S/P primary low transverse C-section 01/10/2020   Pregnancy 01/09/2020    Traumatic injury during pregnancy in third trimester 12/28/2019    Past Medical History:  Diagnosis Date   Arthritis    Migraine     Family History  Problem Relation Age of Onset   Hypertension Mother    Cancer Maternal Grandmother    Heart disease Maternal Grandfather    Liver cancer Maternal Grandfather    Brain cancer Paternal Grandfather    Past Surgical History:  Procedure Laterality Date   CESAREAN SECTION N/A 01/10/2020   Procedure: CESAREAN SECTION;  Surgeon: Huel Cote, MD;  Location: [facility] LD ORS;  Service: Obstetrics;  Laterality: N/A;   DILATION AND CURETTAGE OF UTERUS     Social History   Social History Narrative   Not on file   Immunization History  Administered Date(s) Administered   Influenza,inj,Quad PF,6+ Mos 08/18/2022   PFIZER Comirnaty(Gray Top)Covid-19 Tri-Sucrose Vaccine 02/11/2020, 03/03/2020, 11/24/2020   PNEUMOCOCCAL CONJUGATE-20 09/30/2021   PPD Test 01/25/2022   Tdap 12/13/2019     Objective: Vital Signs: There were no vitals taken for this visit.   Physical Exam   Musculoskeletal Exam: ***  CDAI Exam: CDAI Score: -- Patient Global: --; Provider Global: -- Swollen: --; Tender: -- Joint Exam 04/06/2023   No joint exam has been documented for this visit   There is currently no information documented on the homunculus. Go to the Rheumatology activity and complete the homunculus joint exam.  Investigation: No additional findings.  Imaging: No results found.  Recent Labs: Lab Results  Component Value Date   WBC 7.1 10/20/2022   HGB 12.6 10/20/2022   PLT 315.0 10/20/2022   NA 136 10/20/2022   K 4.1 10/20/2022   CL 104 10/20/2022   CO2 28 10/20/2022   GLUCOSE 92 10/20/2022   BUN 15 10/20/2022   CREATININE 0.66 10/20/2022   BILITOT 0.4 10/20/2022   ALKPHOS 63 10/20/2022   AST 19 10/20/2022   ALT 22 10/20/2022   PROT 6.6 10/20/2022   ALBUMIN 4.3 10/20/2022   CALCIUM 9.1 10/20/2022   GFRAA >60 01/09/2020     Speciality Comments: PLQ eye exam: 11/25/2021 normal (regular eye exam). VF & OCT scheduled in 2 months.  Procedures:  No procedures performed Allergies: Patient has no known allergies.   Assessment / Plan:     Visit Diagnoses: No diagnosis found.  ***  Orders: No orders of the defined types were placed in this encounter.  No orders of the defined types were placed in this encounter.    Follow-Up Instructions: No follow-ups on file.   Fuller Plan, MD  Note - This record has been created using [employer].  Chart creation errors have been sought, but may not always  have been located. Such creation errors do not reflect on  the standard of medical care.

## 2023-04-06 ENCOUNTER — Encounter: Payer: Self-pay | Admitting: Internal Medicine

## 2023-04-06 ENCOUNTER — Ambulatory Visit: Payer: BC Managed Care – PPO | Attending: Internal Medicine | Admitting: Internal Medicine

## 2023-04-06 VITALS — BP 111/82 | HR 84 | Resp 12 | Ht 62.0 in | Wt 158.0 lb

## 2023-04-06 DIAGNOSIS — M0609 Rheumatoid arthritis without rheumatoid factor, multiple sites: Secondary | ICD-10-CM | POA: Diagnosis not present

## 2023-04-06 DIAGNOSIS — Z79899 Other long term (current) drug therapy: Secondary | ICD-10-CM | POA: Diagnosis not present

## 2023-04-06 DIAGNOSIS — M65342 Trigger finger, left ring finger: Secondary | ICD-10-CM | POA: Diagnosis not present

## 2023-04-06 DIAGNOSIS — M25531 Pain in right wrist: Secondary | ICD-10-CM

## 2023-04-06 LAB — CBC WITH DIFFERENTIAL/PLATELET
Absolute Monocytes: 628 cells/uL (ref 200–950)
Basophils Relative: 0.7 %
Eosinophils Relative: 1.8 %
Lymphs Abs: 1752 cells/uL (ref 850–3900)
MCH: 30 pg (ref 27.0–33.0)
Neutro Abs: 4738 cells/uL (ref 1500–7800)
Neutrophils Relative %: 64.9 %
WBC: 7.3 10*3/uL (ref 3.8–10.8)

## 2023-04-07 LAB — CBC WITH DIFFERENTIAL/PLATELET
Basophils Absolute: 51 cells/uL (ref 0–200)
Eosinophils Absolute: 131 cells/uL (ref 15–500)
HCT: 38.6 % (ref 35.0–45.0)
Hemoglobin: 12.8 g/dL (ref 11.7–15.5)
MCHC: 33.2 g/dL (ref 32.0–36.0)
MCV: 90.6 fL (ref 80.0–100.0)
MPV: 10.8 fL (ref 7.5–12.5)
Monocytes Relative: 8.6 %
Platelets: 308 10*3/uL (ref 140–400)
RBC: 4.26 10*6/uL (ref 3.80–5.10)
RDW: 11.6 % (ref 11.0–15.0)
Total Lymphocyte: 24 %

## 2023-04-07 LAB — SEDIMENTATION RATE: Sed Rate: 11 mm/h (ref 0–20)

## 2023-04-07 LAB — BASIC METABOLIC PANEL WITH GFR
BUN: 16 mg/dL (ref 7–25)
CO2: 24 mmol/L (ref 20–32)
Calcium: 9.4 mg/dL (ref 8.6–10.2)
Chloride: 105 mmol/L (ref 98–110)
Creat: 0.75 mg/dL (ref 0.50–0.97)
Glucose, Bld: 91 mg/dL (ref 65–99)
Potassium: 4.3 mmol/L (ref 3.5–5.3)
Sodium: 137 mmol/L (ref 135–146)
eGFR: 106 mL/min/{1.73_m2} (ref >=60)

## 2023-04-08 NOTE — Progress Notes (Signed)
Lab results look great normal sed rate, blood count, and metabolic panel. I don't think any medication change is needed. We can follow up as planned or she can let us know if hand swelling or trigger finger become more problematic.

## 2023-04-13 ENCOUNTER — Encounter: Payer: Self-pay | Admitting: Family Medicine

## 2023-04-13 ENCOUNTER — Ambulatory Visit (INDEPENDENT_AMBULATORY_CARE_PROVIDER_SITE_OTHER): Payer: BC Managed Care – PPO | Admitting: Family Medicine

## 2023-04-13 VITALS — BP 108/68 | HR 97 | Temp 98.8°F | Ht 62.0 in | Wt 159.4 lb

## 2023-04-13 DIAGNOSIS — F900 Attention-deficit hyperactivity disorder, predominantly inattentive type: Secondary | ICD-10-CM

## 2023-04-13 DIAGNOSIS — Z79899 Other long term (current) drug therapy: Secondary | ICD-10-CM | POA: Diagnosis not present

## 2023-04-13 DIAGNOSIS — F339 Major depressive disorder, recurrent, unspecified: Secondary | ICD-10-CM

## 2023-04-13 DIAGNOSIS — G43009 Migraine without aura, not intractable, without status migrainosus: Secondary | ICD-10-CM

## 2023-04-13 MED ORDER — AMPHETAMINE-DEXTROAMPHET ER 15 MG PO CP24: 15.0000 mg | ORAL_CAPSULE | ORAL | 0 refills | Status: DC

## 2023-04-13 NOTE — Patient Instructions (Addendum)
Let us know if/when you need refills.   Send me a message in 1 month if you want to change back to the 20 mg dosage of the Adderall.   Let us know if you need anything.  Please consider counseling. Contact [phone] to schedule an appointment or inquire about cost/insurance coverage.  [facility] located at [address], [address], [place], [place].  Phone number = [phone].  Dr. Regan Lemming - Adult Psychiatry.    [facility] located at [address] [place], [place], [place]. Phone number = [phone].   [facility] located at [address], [place], [place].  Phone number = [phone].   [facility] located at [address] [place], [place], [place].  Phone number = [phone].

## 2023-04-13 NOTE — Progress Notes (Signed)
Chief Complaint  Patient presents with   Follow-up    6 month    Colleen [name] is 37 y.o. female here for ADHD follow up.  Patient is currently on Adderall XR 20 mg/d and takes it 4x/week. Symptoms are well controlled.  Side effects include: slight decreased appetite/insomnia, sweating, headaches but better Patient believes their dose should be not significantly changed. Denies tics, weight loss, difficulties with sleep, self-medication, alcohol/drug abuse, chest pain, or palpitations.  Depression Taking Zoloft 50 mg/d. Reports compliance, no AE's. No HI or SI. No self medication. Is not following with a counselor/therapist.   Migraines Controlled overall. Takes Maxalt 10 mg prn. Works well, does not use often. No AE's. Has 3 d of month with a headache. No neuro s/s's.   Past Medical History:  Diagnosis Date   ADHD    Arthritis    Migraine     BP 108/68 (BP Location: Left Arm, Patient Position: Sitting, Cuff Size: Normal)   Pulse 97   Temp 98.8 F (37.1 C) (Oral)   Ht 5\' 2"  (1.575 m)   Wt 159 lb 6 oz (72.3 kg)   LMP 03/13/2023   SpO2 94%   BMI 29.15 kg/m  Gen- awake, alert, appearing stated age Heart- RRR Lungs- CTAB, no accessory muscle use Neuro- no facial tics Psych- age appropriate judgment and insight, normal mood and affect  Attention deficit hyperactivity disorder (ADHD), predominantly inattentive type  Depression, recurrent (HCC)  Migraine without aura and without status migrainosus, not intractable  Encounter for long-term (current) use of high-risk medication - Plan: Drug Monitoring Panel [phone] , Urine  Chronic, stable.  Decrease Adderall XR from 20 mg daily to 15 mg daily.  Send message in 1 month to update how she is doing.  Update CSC and UDS. Chronic, stable. Cont Zoloft 50 mg/d.  Counseling information provided in her paperwork. Chronic, stable. Cont Maxalt 10 mg qd prn.  F/u in 6 mo for CPE. Pt voiced understanding and agreement to the  plan.  Jilda Roche [place], DO 04/13/23 9:16 AM

## 2023-04-15 LAB — DRUG MONITORING PANEL 376104, URINE

## 2023-04-15 LAB — DM TEMPLATE

## 2023-04-18 LAB — DRUG MONITORING PANEL 376104, URINE: Desmethyltramadol: NEGATIVE ng/mL (ref <100)

## 2023-04-19 LAB — DRUG MONITORING PANEL 376104, URINE
Amphetamines: POSITIVE ng/mL — AB (ref <500)
Barbiturates: NEGATIVE ng/mL (ref <300)
Benzodiazepines: NEGATIVE ng/mL (ref <100)
Cocaine Metabolite: NEGATIVE ng/mL (ref <150)
Desmethyltramadol: NEGATIVE ng/mL (ref <100)
Methamphetamine: NEGATIVE ng/mL (ref <250)
Opiates: NEGATIVE ng/mL (ref <100)
Oxycodone: NEGATIVE ng/mL (ref <100)
Tramadol: NEGATIVE ng/mL (ref <100)

## 2023-05-06 DIAGNOSIS — F900 Attention-deficit hyperactivity disorder, predominantly inattentive type: Secondary | ICD-10-CM | POA: Diagnosis not present

## 2023-06-03 DIAGNOSIS — F900 Attention-deficit hyperactivity disorder, predominantly inattentive type: Secondary | ICD-10-CM | POA: Diagnosis not present

## 2023-06-20 DIAGNOSIS — F900 Attention-deficit hyperactivity disorder, predominantly inattentive type: Secondary | ICD-10-CM | POA: Diagnosis not present

## 2023-06-28 ENCOUNTER — Other Ambulatory Visit: Payer: Self-pay | Admitting: Family Medicine

## 2023-06-28 ENCOUNTER — Other Ambulatory Visit: Payer: Self-pay | Admitting: *Deleted

## 2023-06-28 DIAGNOSIS — M0609 Rheumatoid arthritis without rheumatoid factor, multiple sites: Secondary | ICD-10-CM

## 2023-06-28 MED ORDER — AMPHETAMINE-DEXTROAMPHET ER 15 MG PO CP24: 15.0000 mg | ORAL_CAPSULE | ORAL | 0 refills | Status: DC

## 2023-06-28 MED ORDER — HYDROXYCHLOROQUINE SULFATE 200 MG PO TABS: 300.0000 mg | ORAL_TABLET | Freq: Every day | ORAL | 0 refills | Status: DC

## 2023-06-28 NOTE — Telephone Encounter (Signed)
Requesting: Adderall XR 15mg   Contract: 04/13/23 UDS: 04/13/23 Last Visit: 04/13/23 Next Visit: 10/31/23 Last Refill: 04/13/23 #30 and 0RF   Please Advise

## 2023-06-28 NOTE — Telephone Encounter (Signed)
Last Fill: 10/04/2022  Eye exam: Patient will call [facility], Pura Spice to have [facility] Eye exam faxed to [place], appt 11/2022 per patient.  Labs: 04/06/2023 Lab results look great normal sed rate, blood count, and metabolic panel. I don't think any medication change is needed. We can follow up as planned or she can let us know if hand swelling or trigger finger become more problematic.   Next Visit: 10/07/2023  Last Visit: 04/06/2023  DX: Rheumatoid arthritis of multiple sites with negative rheumatoid factor   Current Dose per office note 04/06/2023: hydroxychloroquine 300 mg daily   Okay to refill Plaquenil?

## 2023-07-08 DIAGNOSIS — F900 Attention-deficit hyperactivity disorder, predominantly inattentive type: Secondary | ICD-10-CM | POA: Diagnosis not present

## 2023-07-22 DIAGNOSIS — F900 Attention-deficit hyperactivity disorder, predominantly inattentive type: Secondary | ICD-10-CM | POA: Diagnosis not present

## 2023-07-27 ENCOUNTER — Ambulatory Visit: Payer: BC Managed Care – PPO | Admitting: Medical

## 2023-07-27 ENCOUNTER — Ambulatory Visit (INDEPENDENT_AMBULATORY_CARE_PROVIDER_SITE_OTHER): Payer: BC Managed Care – PPO

## 2023-07-27 ENCOUNTER — Ambulatory Visit: Payer: BC Managed Care – PPO | Admitting: Family Medicine

## 2023-07-27 VITALS — BP 112/82 | HR 72 | Temp 98.0°F | Resp 16 | Ht 62.0 in | Wt 162.8 lb

## 2023-07-27 DIAGNOSIS — R0982 Postnasal drip: Secondary | ICD-10-CM | POA: Diagnosis not present

## 2023-07-27 DIAGNOSIS — R059 Cough, unspecified: Secondary | ICD-10-CM

## 2023-07-27 DIAGNOSIS — R053 Chronic cough: Secondary | ICD-10-CM | POA: Diagnosis not present

## 2023-07-27 DIAGNOSIS — J309 Allergic rhinitis, unspecified: Secondary | ICD-10-CM | POA: Diagnosis not present

## 2023-07-27 MED ORDER — ALBUTEROL SULFATE HFA 108 (90 BASE) MCG/ACT IN AERS: 2.0000 | INHALATION_SPRAY | Freq: Four times a day (QID) | RESPIRATORY_TRACT | 0 refills | Status: DC | PRN

## 2023-07-27 MED ORDER — BENZONATATE 100 MG PO CAPS: 100.0000 mg | ORAL_CAPSULE | Freq: Three times a day (TID) | ORAL | 0 refills | Status: DC | PRN

## 2023-07-27 MED ORDER — LEVOCETIRIZINE DIHYDROCHLORIDE 5 MG PO TABS: 5.0000 mg | ORAL_TABLET | Freq: Every evening | ORAL | 3 refills | Status: DC

## 2023-07-27 NOTE — Progress Notes (Signed)
Subjective:    Patient ID: Colleen [name], female    DOB: 06-Jul-1986, 37 y.o.   MRN: [id number]  HPI Discussed the use of AI scribe software for clinical note transcription with the patient, who gave verbal consent to proceed.  History of Present Illness   The patient, a singer by profession, presents with a persistent cough of approximately two months' duration. The cough is described as both dry and productive, with mucus production being more prominent during the night and morning hours. Throughout the day, the patient experiences a recurring tickle in the throat. She denies any associated wheezing but reports an intermittent crackling sensation. There is no history of fever, chills, or sweats related to this cough.  The patient denies any past history of asthma or smoking, and there is no recent exposure to second-hand smoke. She reports no recent episodes of heartburn. The patient has a known history of seasonal allergies, typically presenting with itchiness and headaches, and confirms some nasal congestion during the fall and spring seasons. However, she denies any current nasal congestion. She does report occasional post-nasal drainage and waking up with a sore throat on several mornings.  The patient has attempted over-the-counter cough suppressants with limited success. The cough has been particularly bothersome due to its impact on the patient's voice, which is crucial for her occupation.  Approximately six weeks ago, after the onset of the cough, the patient underwent two COVID-19 tests, both of which returned negative results. The patient's last menstrual cycle began on August 5th.        Review of Systems  Constitutional:  Negative for chills, fatigue and fever.  HENT:  Positive for congestion and postnasal drip. Negative for ear pain, sinus pressure, sinus pain and sore throat.   Respiratory:  Positive for cough. Negative for chest tightness, shortness of breath and  wheezing.   Cardiovascular:  Negative for chest pain and palpitations.  Gastrointestinal:  Negative for abdominal pain.  Musculoskeletal:  Negative for back pain.  Neurological:  Negative for dizziness, seizures and light-headedness.  Hematological:  Negative for adenopathy. Does not bruise/bleed easily.  Psychiatric/Behavioral:  Negative for behavioral problems and confusion.     Past Medical History:  Diagnosis Date   ADHD    Arthritis    Migraine      Social History   Socioeconomic History   Marital status: Married    Spouse name: Not on file   Number of children: Not on file   Years of education: Not on file   Highest education level: Not on file  Occupational History   Not on file  Tobacco Use   Smoking status: Never    Passive exposure: Never   Smokeless tobacco: Never  Vaping Use   Vaping status: Never Used  Substance and Sexual Activity   Alcohol use: Not Currently   Drug use: Never   Sexual activity: Yes    Partners: Male  Other Topics Concern   Not on file  Social History Narrative   Not on file   Social Determinants of Health   Financial Resource Strain: Not on file  Food Insecurity: Not on file  Transportation Needs: Not on file  Physical Activity: Not on file  Stress: Not on file  Social Connections: Not on file  Intimate Partner Violence: Not on file    Past Surgical History:  Procedure Laterality Date   CESAREAN SECTION N/A 01/10/2020   Procedure: CESAREAN SECTION;  Surgeon: Huel Cote, MD;  Location: [facility] LD  ORS;  Service: Obstetrics;  Laterality: N/A;   DILATION AND CURETTAGE OF UTERUS      Family History  Problem Relation Age of Onset   Hypertension Mother    Cancer Maternal Grandmother    Heart disease Maternal Grandfather    Liver cancer Maternal Grandfather    Brain cancer Paternal Grandfather     No Known Allergies  Current Outpatient Medications on File Prior to Visit  Medication Sig Dispense Refill    amphetamine-dextroamphetamine (ADDERALL XR) 15 MG 24 hr capsule TAKE ONE CAPSULE BY MOUTH EVERY MORNING 30 capsule 0   [START ON 08/27/2023] amphetamine-dextroamphetamine (ADDERALL XR) 15 MG 24 hr capsule Take 1 capsule by mouth every morning. 30 capsule 0   [START ON 07/28/2023] amphetamine-dextroamphetamine (ADDERALL XR) 15 MG 24 hr capsule Take 1 capsule by mouth every morning. 30 capsule 0   hydroxychloroquine (PLAQUENIL) 200 MG tablet Take 1.5 tablets (300 mg total) by mouth daily. 135 tablet 0   rizatriptan (MAXALT) 10 MG tablet Take 1 tablet (10 mg total) by mouth as needed for migraine. May repeat in 2 hours if needed 10 tablet 3   sertraline (ZOLOFT) 50 MG tablet Take 1 tablet (50 mg total) by mouth daily. 90 tablet 3   No current facility-administered medications on file prior to visit.    BP 112/82 (BP Location: Left Arm, Patient Position: Sitting, Cuff Size: Normal)   Pulse 72   Temp 98 F (36.7 C) (Oral)   Resp 16   Ht 5\' 2"  (1.575 m)   Wt 162 lb 12.8 oz (73.8 kg)   SpO2 99%   BMI 29.78 kg/m        Objective:   Physical Exam   General Mental Status- Alert. General Appearance- Not in acute distress.   Skin General: Color- Normal Color. Moisture- Normal Moisture.  Neck Carotid Arteries- Normal color. Moisture- Normal Moisture. No carotid bruits. No JVD.  Chest and Lung Exam Auscultation: Breath Sounds:-Normal.  Cardiovascular Auscultation:Rythm- Regular. Murmurs & Other Heart Sounds:Auscultation of the heart reveals- No Murmurs.   Neurologic Cranial Nerve exam:- CN III-XII intact(No nystagmus), symmetric smile. Strength:- 5/5 equal and symmetric strength both upper and lower extremities.    Heent- mild boggy turbinates. +pnd. No tonsil hypertrophy.  Lower ext- calfs symmeteric. Negative homans signs. No pedal edema bilaterally.    Assessment & Plan:   Assessment and Plan    Chronic Cough Persistent cough for approximately two months, with both dry  and productive components. No fever, chills, or sweats. No history of asthma or smoking. Possible post-nasal drainage and history of seasonal allergies. No recent COVID-19 infection. Occupation involves singing and speaking. -Order chest x-ray to rule out walking pneumonia. -Prescribe Xyzal (antihistamine) nightly to address potential allergy component. -Prescribe benzonatate up to every 8 hours as needed to suppress cough reflex. -Provide albuterol inhaler as a backup for potential airway tightening; instruct patient to use if no improvement with other medications. -Schedule follow-up appointment in approximately two weeks (sooner if symptoms worsen or do not improve).        Esperanza Richters, PA-C

## 2023-07-27 NOTE — Patient Instructions (Addendum)
Chronic Cough(possible allergic rhinitis underling cause of cough) Persistent cough for approximately two months, with both dry and productive components. No fever, chills, or sweats. No history of asthma or smoking. Possible post-nasal drainage and history of seasonal allergies. No recent COVID-19 infection. Occupation involves singing and speaking. -Order chest x-ray to rule out walking pneumonia. -Prescribe Xyzal (antihistamine) nightly to address potential allergy component. -Prescribe benzonatate up to every 8 hours as needed to suppress cough reflex. -Provide albuterol inhaler as a backup for potential airway tightening; instruct patient to use if no improvement with other medications. -Schedule follow-up appointment in approximately two weeks (sooner if symptoms worsen or do not improve).

## 2023-08-08 DIAGNOSIS — F900 Attention-deficit hyperactivity disorder, predominantly inattentive type: Secondary | ICD-10-CM | POA: Diagnosis not present

## 2023-08-11 DIAGNOSIS — N939 Abnormal uterine and vaginal bleeding, unspecified: Secondary | ICD-10-CM | POA: Diagnosis not present

## 2023-08-12 ENCOUNTER — Ambulatory Visit: Payer: BC Managed Care – PPO | Admitting: Family Medicine

## 2023-08-22 DIAGNOSIS — F900 Attention-deficit hyperactivity disorder, predominantly inattentive type: Secondary | ICD-10-CM | POA: Diagnosis not present

## 2023-08-29 DIAGNOSIS — F900 Attention-deficit hyperactivity disorder, predominantly inattentive type: Secondary | ICD-10-CM | POA: Diagnosis not present

## 2023-09-09 DIAGNOSIS — F900 Attention-deficit hyperactivity disorder, predominantly inattentive type: Secondary | ICD-10-CM | POA: Diagnosis not present

## 2023-09-23 NOTE — Progress Notes (Signed)
Office Visit Note  Patient: Colleen [name]             Date of Birth: 07-15-1986           MRN: [id number]             PCP: Sharlene Dory, DO Referring: Sharlene Dory* Visit Date: 10/07/2023   Subjective:  History of Present Illness: Colleen [name] is a 37 y.o. female here for follow up for seropositive RA on hydroxychloroquine 300 mg daily.  Symptoms have been doing well overall improvement of the hand pain and finger triggering discussed at her last visit for several months although it has been symptomatic and within the past few weeks.  Not requiring any regular use of ibuprofen.  Does not have prolonged morning stiffness.  She had a persistent cough earlier this summer without any specific cause identified this resolved on its own after weeks.  Currently on azithromycin for sinus infection.  She is scheduled for hysterectomy for adenomyosis on November 18.  Previous HPI 04/06/2023 Colleen [name] is a 37 y.o. female here for follow up for seropositive RA on hydroxychloroquine 300 mg daily.  Overall symptoms have been doing pretty well but some are increased in the past month.  Noticing some increased pain and some visible redness across the tops of her knuckles.  Also having some increase in pain in both wrists for the past week.  Trigger finger in her left ring finger that has been present for months was also getting more painful and consistently catching about a month ago this partially better today compared to the preceding weeks.  For out was bad enough that she was avoiding making a tight fist in many cases to prevent provoking this and symptoms are most noticeable early in the mornings.   Previous HPI 10/04/22 Colleen [name] is a 37 y.o. female here for follow up for seropositive RA now back on the hydroxychloroquine 300 mg daily.  We tapered off and discontinued this medication earlier in the year and she experienced increasing joint pain  and's stiffness starting at about 6 weeks since stopping medicine completely.  Overall doing well again on this medicine.  Currently has some pain at the right wrist this has been ongoing for about 3 weeks.  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.  Overall symptoms have been gradually improving over time pain most commonly provoked with some wrist rotating and bending movements.  She is also noticed some bilateral hip pain that most commonly bothers her at nighttime especially if she lies on either side.  When she falls asleep this is not causing frequent awakenings.  She has been training for a half marathon recently with increased running time.     Previous HPI 03/31/2022 Colleen [name] is a 37 y.o. female here for follow up for RA on HCQ 300 mg daily. Symptoms appeared well controlled at last visit with normal ESR highly positive CCP Abs. She is feeling very well with no daily arthritis symptoms. She estimates taking the HCQ about 75% of days due to forgetting sometimes. Left ankle is improved since last visit.    Previous HPI 11/09/21 Colleen [name] is a 37 y.o. female is here for rheumatoid arthritis currently on hydroxychloroquine 300 mg PO daily.  She was originally diagnosed with this problem about 11 years ago after developing joint pain and stiffness in bilateral shoulders wrists and hands with work-up positive for CCP  antibodies and elevated sedimentation rate.  She initially started treatment on hydroxychloroquine for this with Dr. Cardell Peach and then later seeing Dr. Sharmon Revere for this problem for years. No known evidence of joint erosion or significant deforming changes or extrarticular complications. At present her symptoms are well controlled, a few minutes of stiffness in the morning but not much pain and no swelling. She has noticed a small deviation in distal finger joints. She stopped taking hydroxychloroquine during her first pregnancy and  experienced some disease flares requiring prednisone for this in 2017. During her second pregnancy last year she continued medication and did not experience similar problems. She had an episode over this summer with increased symptoms but improved on its own without a change in treatments. She sustained a left ankle sprain injury running treated with immobilization and most mostly recovered from this. She sees her eye doctor in [place] with nearsightedness and hydroxychloroquine toxicity monitoring no problem identified so far, most recent appointment in October was rescheduled. She denies new skin rashes or hyperpigmentation areas. No history of raynaud's symptoms but feels cold a large amount of the time.   Review of Systems  Constitutional:  Positive for fatigue.  HENT:  Negative for mouth sores and mouth dryness.   Eyes:  Positive for dryness.  Respiratory:  Negative for shortness of breath.   Cardiovascular:  Negative for chest pain and palpitations.  Gastrointestinal:  Negative for blood in stool, constipation and diarrhea.  Endocrine: Negative for increased urination.  Genitourinary:  Negative for involuntary urination.  Musculoskeletal:  Positive for morning stiffness. Negative for joint pain, gait problem, joint pain, joint swelling, myalgias, muscle weakness, muscle tenderness and myalgias.  Skin:  Negative for color change, rash, hair loss and sensitivity to sunlight.  Allergic/Immunologic: Negative for susceptible to infections.  Neurological:  Positive for headaches. Negative for dizziness.  Hematological:  Negative for swollen glands.  Psychiatric/Behavioral:  Positive for sleep disturbance. Negative for depressed mood. The patient is not nervous/anxious.     PMFS History:  Patient Active Problem List   Diagnosis Date Noted   Attention deficit hyperactivity disorder (ADHD), predominantly inattentive type 04/13/2023   Trigger finger, left ring finger 04/06/2023   Long-term  use of hydroxychloroquine 10/04/2022   Bilateral hip pain 10/04/2022   Pain in right wrist 10/04/2022   Depression, recurrent (HCC) 11/13/2021   Migraine without aura and without status migrainosus, not intractable 11/13/2021   Rheumatoid arthritis of multiple sites with negative rheumatoid factor (HCC) 09/30/2021   Sprain of anterior talofibular ligament of left ankle 08/06/2021   S/P primary low transverse C-section 01/10/2020   Pregnancy 01/09/2020   Traumatic injury during pregnancy in third trimester 12/28/2019    Past Medical History:  Diagnosis Date   ADHD    Arthritis    Migraine     Family History  Problem Relation Age of Onset   Hypertension Mother    Cancer Maternal Grandmother    Heart disease Maternal Grandfather    Liver cancer Maternal Grandfather    Brain cancer Paternal Grandfather    Past Surgical History:  Procedure Laterality Date   CESAREAN SECTION N/A 01/10/2020   Procedure: CESAREAN SECTION;  Surgeon: Huel Cote, MD;  Location: [facility] LD ORS;  Service: Obstetrics;  Laterality: N/A;   DILATION AND CURETTAGE OF UTERUS     Social History   Social History Narrative   Not on file   Immunization History  Administered Date(s) Administered   Influenza,inj,Quad PF,6+ Mos 08/18/2022   PFIZER  Comirnaty(Gray Top)Covid-19 Tri-Sucrose Vaccine 02/11/2020, 03/03/2020, 11/24/2020   PNEUMOCOCCAL CONJUGATE-20 09/30/2021   PPD Test 01/25/2022   Tdap 12/13/2019     Objective: Vital Signs: BP 119/81 (BP Location: Left Arm, Patient Position: Sitting, Cuff Size: Large)   Pulse 81   Resp 12   Ht 5\' 2"  (1.575 m)   Wt 164 lb (74.4 kg)   BMI 30.00 kg/m    Physical Exam Eyes:     Conjunctiva/sclera: Conjunctivae normal.  Cardiovascular:     Rate and Rhythm: Normal rate and regular rhythm.  Pulmonary:     Effort: Pulmonary effort is normal.     Breath sounds: Normal breath sounds.  Musculoskeletal:     Right lower leg: No edema.     Left lower leg: No  edema.  Lymphadenopathy:     Cervical: No cervical adenopathy.  Skin:    General: Skin is warm and dry.     Findings: No rash.  Neurological:     Mental Status: She is alert.  Psychiatric:        Mood and Affect: Mood normal.      Musculoskeletal Exam:  Shoulders full ROM no tenderness or swelling Elbows full ROM no tenderness or swelling Wrists full ROM no tenderness or swelling Fingers full ROM no tenderness or swelling Hip normal internal and external rotation without pain, no tenderness to lateral hip palpation Knees full ROM no tenderness or swelling   Investigation: No additional findings.  Imaging: No results found.  Recent Labs: Lab Results  Component Value Date   WBC 7.3 04/06/2023   HGB 12.8 04/06/2023   PLT 308 04/06/2023   NA 137 04/06/2023   K 4.3 04/06/2023   CL 105 04/06/2023   CO2 24 04/06/2023   GLUCOSE 91 04/06/2023   BUN 16 04/06/2023   CREATININE 0.75 04/06/2023   BILITOT 0.4 10/20/2022   ALKPHOS 63 10/20/2022   AST 19 10/20/2022   ALT 22 10/20/2022   PROT 6.6 10/20/2022   ALBUMIN 4.3 10/20/2022   CALCIUM 9.4 04/06/2023   GFRAA >60 01/09/2020    Speciality Comments: PLQ eye exam: 11/25/2021 normal (regular eye exam). VF & OCT scheduled in 2 months.  Patient will call [facility], Pura Spice to have PLQ Eye exam faxed to [place], appt 11/2022 per patient.   Procedures:  No procedures performed Allergies: Patient has no known allergies.   Assessment / Plan:     Visit Diagnoses: Rheumatoid arthritis of multiple sites with negative rheumatoid factor (HCC) - Agree with continued use of as needed nonsteroidal anti-inflammatories. - Plan: hydroxychloroquine (PLAQUENIL) 200 MG tablet  Joint inflammation appears well-controlled based on symptoms and exam today negative for any peripheral joint synovitis.  Plan to continue hydroxychloroquine 300 mg daily.  Can can continue use of ibuprofen as needed.  Long-term use of hydroxychloroquine -  hydroxychloroquine 300 mg daily.  Needs updated PLQ eye exam.  Reportedly had normal exam January 2024 per patient though I have not personally reviewed visit or result note.  Current URI but has not been a recurrent problem.  She would be getting preoperative labs for surgery in about 10 days so recommend we can just wait and check those for blood count and kidney function.  Trigger finger, left ring finger  Not a significant complaint today.  Improved without specific intervention.  Orders: No orders of the defined types were placed in this encounter.  Meds ordered this encounter  Medications   hydroxychloroquine (PLAQUENIL) 200 MG tablet  Sig: Take 1.5 tablets (300 mg total) by mouth daily.    Dispense:  135 tablet    Refill:  0     Follow-Up Instructions: Return in about 6 months (around 04/05/2024) for RA on HCQ f/u 6mos.   Fuller Plan, MD  Note - This record has been created using [employer].  Chart creation errors have been sought, but may not always  have been located. Such creation errors do not reflect on  the standard of medical care.

## 2023-09-30 DIAGNOSIS — F900 Attention-deficit hyperactivity disorder, predominantly inattentive type: Secondary | ICD-10-CM | POA: Diagnosis not present

## 2023-10-05 DIAGNOSIS — Z01818 Encounter for other preprocedural examination: Secondary | ICD-10-CM | POA: Diagnosis not present

## 2023-10-07 ENCOUNTER — Encounter: Payer: Self-pay | Admitting: Internal Medicine

## 2023-10-07 ENCOUNTER — Ambulatory Visit: Payer: BC Managed Care – PPO | Attending: Internal Medicine | Admitting: Internal Medicine

## 2023-10-07 VITALS — BP 119/81 | HR 81 | Resp 12 | Ht 62.0 in | Wt 164.0 lb

## 2023-10-07 DIAGNOSIS — M0609 Rheumatoid arthritis without rheumatoid factor, multiple sites: Secondary | ICD-10-CM | POA: Diagnosis not present

## 2023-10-07 DIAGNOSIS — M65342 Trigger finger, left ring finger: Secondary | ICD-10-CM | POA: Diagnosis not present

## 2023-10-07 DIAGNOSIS — Z79899 Other long term (current) drug therapy: Secondary | ICD-10-CM | POA: Diagnosis not present

## 2023-10-07 MED ORDER — HYDROXYCHLOROQUINE SULFATE 200 MG PO TABS
300.0000 mg | ORAL_TABLET | Freq: Every day | ORAL | 0 refills | Status: DC
Start: 2023-10-07 — End: 2024-04-26

## 2023-10-11 ENCOUNTER — Encounter (HOSPITAL_BASED_OUTPATIENT_CLINIC_OR_DEPARTMENT_OTHER): Payer: Self-pay | Admitting: Obstetrics and Gynecology

## 2023-10-11 ENCOUNTER — Other Ambulatory Visit: Payer: Self-pay

## 2023-10-11 NOTE — Progress Notes (Addendum)
Spoke w/ via phone for pre-op interview---Colleen [name] needs dos---- UPT per anesthesia, surgeon orders pending        [name] results------10/13/23 [name] appt for cbc, type & screen COVID test -----patient states asymptomatic no test needed Arrive at -------0530 on Monday, 10/17/2023 NPO after MN NO Solid Food.  Clear liquids from MN until---0430 Med rec completed Medications to take morning of surgery -----Plaquenil, Zoloft & Maxalt prn, Do NOT take Adderall on the morning of surgery. Diabetic medication -----n/a Patient instructed no nail polish to be worn day of surgery Patient instructed to bring photo id and insurance card day of surgery Patient aware to have Driver (ride ) / caregiver    for 24 hours after surgery - husban, Colleen [name] Patient Special Instructions -----Extended / overnight stay instructions given. Pre-Op special Instructions -----Requested orders from Dr. Hinton Rao via Epic IB on 10/11/23. Patient verbalized understanding of instructions that were given at this phone interview. Patient denies chest pain, sob, fever, cough at the interview. Patient states that she had a flu-like illness that started on 09/27/23. She states that when she went to see Dr. Hinton Rao for her pre-op appointment, she was put on a Z-pack for a sinus infection. She completed the Z-pack on 10/09/23. Patient states that she has improved but still has some mild nasal congestion. I reviewed this case with Dr. Salvadore Farber, MDA. Per Dr. Salvadore Farber patient needs to be 4 weeks post of the beginning of illness with no symptoms. Per Dr. Salvadore Farber, patient can have surgery on 10/25/23 or after if she is asymptomatic. I spoke with Dahlia Client, scheduler for Dr. Hinton Rao and let her know the above information. Patient called me back after her surgeon's office rescheduled her surgery. She wanted to know if she could come in and let anesthesia evaluate her and determine if she was ok to go ahead and have surgery. I spoke  with Dr. Salvadore Farber again. Per Dr. Salvadore Farber, anytime someone has been sick with an upper respiratory infection their airway remains reactive for several weeks. Per Dr. Salvadore Farber, patient needs to wait until after 10/25/23 with no symptoms.

## 2023-10-13 ENCOUNTER — Encounter (HOSPITAL_COMMUNITY): Payer: BC Managed Care – PPO

## 2023-10-21 DIAGNOSIS — F900 Attention-deficit hyperactivity disorder, predominantly inattentive type: Secondary | ICD-10-CM | POA: Diagnosis not present

## 2023-10-31 ENCOUNTER — Ambulatory Visit (INDEPENDENT_AMBULATORY_CARE_PROVIDER_SITE_OTHER): Payer: BC Managed Care – PPO | Admitting: Family Medicine

## 2023-10-31 ENCOUNTER — Encounter: Payer: Self-pay | Admitting: Family Medicine

## 2023-10-31 VITALS — BP 128/76 | HR 74 | Temp 98.0°F | Resp 16 | Ht 62.0 in | Wt 162.2 lb

## 2023-10-31 DIAGNOSIS — Z23 Encounter for immunization: Secondary | ICD-10-CM

## 2023-10-31 DIAGNOSIS — Z0000 Encounter for general adult medical examination without abnormal findings: Secondary | ICD-10-CM

## 2023-10-31 DIAGNOSIS — G43009 Migraine without aura, not intractable, without status migrainosus: Secondary | ICD-10-CM

## 2023-10-31 LAB — COMPREHENSIVE METABOLIC PANEL
ALT: 27 U/L (ref 0–35)
AST: 21 U/L (ref 0–37)
Albumin: 4.4 g/dL (ref 3.5–5.2)
Alkaline Phosphatase: 64 U/L (ref 39–117)
BUN: 11 mg/dL (ref 6–23)
CO2: 26 meq/L (ref 19–32)
Calcium: 9.6 mg/dL (ref 8.4–10.5)
Chloride: 103 meq/L (ref 96–112)
Creatinine, Ser: 0.7 mg/dL (ref 0.40–1.20)
GFR: 111.68 mL/min (ref >60.00)
Glucose, Bld: 91 mg/dL (ref 70–99)
Potassium: 4.5 meq/L (ref 3.5–5.1)
Sodium: 136 meq/L (ref 135–145)
Total Bilirubin: 0.5 mg/dL (ref 0.2–1.2)
Total Protein: 6.8 g/dL (ref 6.0–8.3)

## 2023-10-31 LAB — CBC
HCT: 38.5 % (ref 36.0–46.0)
Hemoglobin: 12.8 g/dL (ref 12.0–15.0)
MCHC: 33.3 g/dL (ref 30.0–36.0)
MCV: 90.9 fL (ref 78.0–100.0)
Platelets: 297 10*3/uL (ref 150.0–400.0)
RBC: 4.24 Mil/uL (ref 3.87–5.11)
RDW: 12.8 % (ref 11.5–15.5)
WBC: 6.1 10*3/uL (ref 4.0–10.5)

## 2023-10-31 LAB — LIPID PANEL
Cholesterol: 190 mg/dL (ref 0–200)
HDL: 51.5 mg/dL (ref >39.00)
LDL Cholesterol: 119 mg/dL — ABNORMAL HIGH (ref 0–99)
NonHDL: 138.33
Total CHOL/HDL Ratio: 4
Triglycerides: 96 mg/dL (ref 0.0–149.0)
VLDL: 19.2 mg/dL (ref 0.0–40.0)

## 2023-10-31 MED ORDER — AMPHETAMINE-DEXTROAMPHET ER 15 MG PO CP24: 15.0000 mg | ORAL_CAPSULE | ORAL | 0 refills | Status: DC

## 2023-10-31 MED ORDER — RIZATRIPTAN BENZOATE 10 MG PO TABS: 10.0000 mg | ORAL_TABLET | ORAL | 3 refills | Status: DC | PRN

## 2023-10-31 MED ORDER — AMPHETAMINE-DEXTROAMPHET ER 15 MG PO CP24: 15.0000 mg | ORAL_CAPSULE | Freq: Every morning | ORAL | 0 refills | Status: DC

## 2023-10-31 NOTE — Patient Instructions (Addendum)
Give [place] 2-3 business days to get the results of your labs back.   Keep the diet clean and stay active.  Please get me a copy of your advanced directive form at your convenience.   If you are interested in stopping the Zoloft, cut the tab in half and take it daily for 2 weeks and then stop.   Let us know if you need anything.

## 2023-10-31 NOTE — Progress Notes (Signed)
Chief Complaint  Patient presents with   Annual Exam    Annual Exam     Well Woman Colleen [name] is here for a complete physical.   Her last physical was >1 year ago.  Current diet: in general, a "healthy" diet. Current exercise: Zumba, running. Contraception? Yes Patient's last menstrual period was 09/20/2023 (exact date). Fatigue out of ordinary? No Seatbelt? Yes Advanced directive? Yes  Health Maintenance Pap/HPV- Yes Tetanus- Yes HIV screening- Yes Hep C screening- Yes  Past Medical History:  Diagnosis Date   Adenomyosis    ADHD    Follows w/ PCP.   Anemia    hx of   Migraine    Follows w/ PCP, Dr. Carmelia Roller @ [place].   Rheumatoid arthritis (HCC)    Follows w/ Dr. Orest Dikes, rheumatology.   Wears glasses      Past Surgical History:  Procedure Laterality Date   CESAREAN SECTION N/A 01/10/2020   Procedure: CESAREAN SECTION;  Surgeon: Huel Cote, MD;  Location: [facility];  Service: Obstetrics;  Laterality: N/A;   DILATION AND CURETTAGE OF UTERUS  2018   HYSTEROSCOPY     x 2   OVUM / OOCYTE RETRIEVAL     x 2    Medications  Current Outpatient Medications on File Prior to Visit  Medication Sig Dispense Refill   amphetamine-dextroamphetamine (ADDERALL XR) 15 MG 24 hr capsule Take 1 capsule by mouth every morning. (Patient taking differently: Take 15 mg by mouth every morning. Only takes on days she works per pt.) 30 capsule 0   hydroxychloroquine (PLAQUENIL) 200 MG tablet Take 1.5 tablets (300 mg total) by mouth daily. 135 tablet 0   ibuprofen (ADVIL) 800 MG tablet Take 800 mg by mouth 3 (three) times daily.     Multiple Vitamin (MULTIVITAMIN WITH MINERALS) TABS tablet Take 1 tablet by mouth daily.     oxyCODONE-acetaminophen (PERCOCET/ROXICET) 5-325 MG tablet Take 1 tablet by mouth 4 (four) times daily as needed.     sertraline (ZOLOFT) 50 MG tablet Take 1 tablet (50 mg total) by mouth daily. 90 tablet 3    Allergies No Known  Allergies  Review of Systems: Constitutional:  no unexpected weight changes Eye:  no recent significant change in vision Ear/Nose/Mouth/Throat:  Ears:  no tinnitus or vertigo and no recent change in hearing Nose/Mouth/Throat:  no complaints of nasal congestion, no sore throat Cardiovascular: no chest pain Respiratory:  no cough and no shortness of breath Gastrointestinal:  no abdominal pain, no change in bowel habits GU:  Female: negative for dysuria or pelvic pain Musculoskeletal/Extremities:  no pain of the joints Integumentary (Skin/Breast):  no abnormal skin lesions reported Neurologic:  no headaches Endocrine:  denies fatigue Hematologic/Lymphatic:  No areas of easy bleeding  Exam BP 128/76 (BP Location: Left Arm, Patient Position: Sitting, Cuff Size: Normal)   Pulse 74   Temp 98 F (36.7 C) (Oral)   Resp 16   Ht 5\' 2"  (1.575 m)   Wt 162 lb 3.2 oz (73.6 kg)   LMP 09/20/2023 (Exact Date)   SpO2 100%   BMI 29.67 kg/m  General:  well developed, well nourished, in no apparent distress Skin:  no significant moles, warts, or growths Head:  no masses, lesions, or tenderness Eyes:  pupils equal and round, sclera anicteric without injection Ears:  canals without lesions, TMs shiny without retraction, no obvious effusion, no erythema Nose:  nares patent, mucosa normal, and no drainage  Throat/Pharynx:  lips and gingiva  without lesion; tongue and uvula midline; non-inflamed pharynx; no exudates or postnasal drainage Neck: neck supple without adenopathy, thyromegaly, or masses Lungs:  clear to auscultation, breath sounds equal bilaterally, no respiratory distress Cardio:  regular rate and rhythm, no bruits, no LE edema Abdomen:  abdomen soft, nontender; bowel sounds normal; no masses or organomegaly Genital: Defer to GYN Musculoskeletal:  symmetrical muscle groups noted without atrophy or deformity Extremities:  no clubbing, cyanosis, or edema, no deformities, no skin  discoloration Neuro:  gait normal; deep tendon reflexes normal and symmetric Psych: well oriented with normal range of affect and appropriate judgment/insight  Assessment and Plan  Well adult exam - Plan: CBC, Comprehensive metabolic panel, Lipid panel  Migraine without aura and without status migrainosus, not intractable - Plan: rizatriptan (MAXALT) 10 MG tablet   Well 37 y.o. female. Counseled on diet and exercise. Advanced directive form requested today.  Flu shot today.  Other orders as above. Follow up in 6 mo. The patient voiced understanding and agreement to the plan.  Jilda Roche [place], DO 10/31/23 9:25 AM

## 2023-11-11 DIAGNOSIS — F900 Attention-deficit hyperactivity disorder, predominantly inattentive type: Secondary | ICD-10-CM | POA: Diagnosis not present

## 2023-11-24 ENCOUNTER — Encounter (HOSPITAL_BASED_OUTPATIENT_CLINIC_OR_DEPARTMENT_OTHER): Payer: Self-pay | Admitting: Obstetrics and Gynecology

## 2023-11-25 ENCOUNTER — Encounter (HOSPITAL_BASED_OUTPATIENT_CLINIC_OR_DEPARTMENT_OTHER): Payer: Self-pay | Admitting: Obstetrics and Gynecology

## 2023-11-25 NOTE — Progress Notes (Signed)
Your procedure is scheduled on :  Monday , 12-05-2023  Report to [facility] AT  _5:30__ AM.   Call this number if you have problems the morning of surgery  :[phone]. Any questions prior to surgery call pre-op nurse , Chantelle Verdi :  [phone]   OUR ADDRESS IS [address].  WE ARE LOCATED IN THE [facility]  PLEASE BRING YOUR INSURANCE CARD AND PHOTO ID DAY OF SURGERY.                                     REMEMBER:  Do not eat food after midnight night before surgery.  You may have clear liquid diet from midnight night before surgery until 4:30 AM.  NO clear liquids after 4:30 AM day of surgery.  This includes no water,  candy/  gum/  mints.    Please brush your teeth morning of surgery and rinse your mouth out.   CLEAR LIQUID DIET  Allowed      Water                                                                   Sports drinks like Gatorade _____________________________________________________________________     TAKE ONLY THESE MEDICATIONS MORNING OF SURGERY: If taking after 4:30 AM may take with sips of water Hydroxychloroquine (plaquenil) Sertraline (zoloft) If needed may take Rizatriptan (maxalt)                                       DO NOT WEAR JEWERLY/  METAL/  PIERCINGS (INCLUDING NO PLASTIC PIERCINGS) DO NOT WEAR LOTIONS, POWDERS, PERFUMES OR NAIL POLISH ON YOUR FINGERNAILS. TOENAIL POLISH IS OK TO WEAR. DO NOT SHAVE FOR 48 HOURS PRIOR TO DAY OF SURGERY.  CONTACTS, GLASSES, OR DENTURES MAY NOT BE WORN TO SURGERY.  REMEMBER: NO SMOKING, VAPING ,  DRUGS OR ALCOHOL FOR 24 HOURS BEFORE YOUR SURGERY.                                    [place] IS NOT RESPONSIBLE  FOR ANY BELONGINGS.                                                                    Marland Kitchen            - Preparing for Surgery Before surgery, you can play an important role.  Because skin is not sterile, your skin needs to be as free of germs  as possible.  You can reduce the number of germs on your skin by washing with CHG (chlorahexidine gluconate) soap before surgery.  CHG is an antiseptic cleaner which kills germs and bonds with the skin to continue killing germs even after washing. Please  DO NOT use if you have an allergy to CHG or antibacterial soaps.  If your skin becomes reddened/irritated stop using the CHG and inform your nurse when you arrive at Short Stay. Do not shave (including legs and underarms) for at least 48 hours prior to the first CHG shower.  You may shave your face/neck. Please follow these instructions carefully:  1.  Shower with CHG Soap the night before surgery and the  morning of Surgery.  2.  If you choose to wash your hair, wash your hair first as usual with your  normal  shampoo.  3.  After you shampoo, rinse your hair and body thoroughly to remove the  shampoo.                                        4.  Use CHG as you would any other liquid soap.  You can apply chg directly  to the skin and wash , chg soap provided, night before and morning of your surgery.  5.  Apply the CHG Soap to your body ONLY FROM THE NECK DOWN.   Do not use on face/ open                           Wound or open sores. Avoid contact with eyes, ears mouth and genitals (private parts).                       Wash face,  Genitals (private parts) with your normal soap.             6.  Wash thoroughly, paying special attention to the area where your surgery  will be performed.  7.  Thoroughly rinse your body with warm water from the neck down.  8.  DO NOT shower/wash with your normal soap after using and rinsing off  the CHG Soap.             9.  Pat yourself dry with a clean towel.            10.  Wear clean pajamas.            11.  Place clean sheets on your bed the night of your first shower and do not  sleep with pets. Day of Surgery : Do not apply any lotions/ powders the morning of surgery.  Please wear clean clothes to the  hospital/surgery center.  IF YOU HAVE ANY SKIN IRRITATION OR PROBLEMS WITH THE SURGICAL SOAP, PLEASE GET A BAR OF GOLD DIAL SOAP AND SHOWER THE NIGHT BEFORE YOUR SURGERY AND THE MORNING OF YOUR SURGERY. PLEASE LET THE NURSE KNOW MORNING OF YOUR SURGERY IF YOU HAD ANY PROBLEMS WITH THE SURGICAL SOAP.   YOUR SURGEON MAY HAVE REQUESTED EXTENDED RECOVERY TIME AFTER YOUR SURGERY. IT COULD BE A  JUST A FEW HOURS  UP TO AN OVERNIGHT STAY.  YOUR SURGEON SHOULD HAVE DISCUSSED THIS WITH YOU PRIOR TO YOUR SURGERY. IN THE EVENT YOU NEED TO STAY OVERNIGHT PLEASE REFER TO THE FOLLOWING GUIDELINES. YOU MAY HAVE UP TO 4 VISITORS  MAY VISIT IN THE EXTENDED RECOVERY ROOM UNTIL 800 PM ONLY.  ONE  VISITOR AGE 62 AND OVER MAY SPEND THE NIGHT AND MUST BE IN EXTENDED RECOVERY ROOM NO LATER THAN 800 PM . YOUR DISCHARGE TIME AFTER YOU SPEND THE NIGHT  IS 900 AM THE MORNING AFTER YOUR SURGERY. YOU MAY PACK A SMALL OVERNIGHT BAG WITH TOILETRIES FOR YOUR OVERNIGHT STAY IF YOU WISH.  REGARDLESS OF IF YOU STAY OVER NIGHT OR ARE DISCHARGED THE SAME DAY YOU WILL BE REQUIRED TO HAVE A RESPONSIBLE ADULT (18 YRS OLD OR OLDER) STAY WITH YOU FOR AT LEAST THE FIRST 24 HOURS WHEN HOME  YOUR PRESCRIPTION MEDICATIONS WILL BE PROVIDED DURING [facility] STAY.  ________________________________________________________________________

## 2023-11-25 NOTE — Progress Notes (Addendum)
Spoke w/ via phone for pre-op interview--- pt Lab needs dos---- urine preg        Lab results------ lab appt 12-01-2023 @ 1300 getting CBC/ T&S COVID test -----patient states asymptomatic no test needed Arrive at ------- 0530 on 12-05-2023 NPO after MN NO Solid Food.  Clear liquids from MN until--- 0430 Med rec completed Medications to take morning of surgery ----- plaquenil, zoloft, if needed may take maxalt Diabetic medication ----- n/a Patient instructed no nail polish to be worn day of surgery Patient instructed to bring photo id and insurance card day of surgery Patient aware to have Driver (ride ) / caregiver    for 24 hours after surgery - husband, Colleen [name] Patient Special Instructions ----- will pick up bag w/ hibiclens and written instructions at lab appt , asked to call for any questions Pre-Op special Instructions -----  requested pre-op orders via inbox message in epic to Dr Ellyn Hack on 11-24-2023 Patient verbalized understanding of instructions that were given at this phone interview. Patient denies chest pain, sob, fever, cough at the interview.

## 2023-11-30 NOTE — H&P (Signed)
 Colleen [name] is an 38 y.o. female [phone] with adenomyosis/pelvic pain for RA TLH/BS possible cystoscopy.  D/W pt r/b/a of surgical procedure also process and expectations.  Will proceed.  Pt's history complicated by Rheumatoid atrhritis, well controlled.  Also h/o IVF pregnancy.    Pertinent Gynecological History: [id number] G1 SAB G2 SVD 7#6, PIH, female G3 LTCS arrest of descent, 7#11, female No abn pap, last 7/22 HR HPV neg No STDs  Menstrual History:  Patient's last menstrual period was 11/13/2023 (exact date).    Past Medical History:  Diagnosis Date   Adenomyosis    ADHD    Follows w/ PCP.   History of anemia    hx of   Migraine    Follows w/ PCP, Dr. Frann @ [place].   Rheumatoid arthritis (HCC)    Follows w/ Dr. Medford Ester, rheumatology.   Wears glasses     Past Surgical History:  Procedure Laterality Date   CESAREAN SECTION N/A 01/10/2020   Procedure: CESAREAN SECTION;  Surgeon: Estelle Service, MD;  Location: [facility] LD [facility]S;  Service: Obstetrics;  Laterality: N/A;   DILATION AND CURETTAGE OF UTERUS  02/16/2017   @[facility] ;  for retained products   HYSTEROSCOPY     x 2   OVUM / OOCYTE RETRIEVAL     x 2  Tooth extraction  Family History  Problem Relation Age of Onset   Hypertension Mother    Cancer Maternal Grandmother    Heart disease Maternal Grandfather    Liver cancer Maternal Grandfather    Brain cancer Paternal Grandfather     Social History:  reports that she has never smoked. She has never been exposed to tobacco smoke. She has never used smokeless tobacco. She reports that she does not currently use alcohol after a past usage of about 1.0 standard drink of alcohol per week. She reports that she does not use drugs. Married, Human Resources Officer  Allergies: No Known Allergies  Meds: adderall, hydroxychloroquine , rizatriptan , sertraline   Review of Systems  Constitutional: Negative.   Respiratory: Negative.    Gastrointestinal: Negative.    Genitourinary:  Positive for menstrual problem and pelvic pain.  Musculoskeletal: Negative.   Skin: Negative.   Neurological: Negative.   Psychiatric/Behavioral: Negative.      Height 5' 2 (1.575 m), weight 72.6 kg, last menstrual period 11/13/2023. Physical Exam Constitutional:      Appearance: Normal appearance.  HENT:     Head: Normocephalic and atraumatic.  Cardiovascular:     Rate and Rhythm: Normal rate and regular rhythm.  Pulmonary:     Effort: Pulmonary effort is normal.     Breath sounds: Normal breath sounds.  Abdominal:     General: Bowel sounds are normal.     Palpations: Abdomen is soft.  Genitourinary:    General: Normal vulva.     Rectum: Normal.  Musculoskeletal:        General: Normal range of motion.     Cervical back: Normal range of motion and neck supple.  Skin:    General: Skin is warm and dry.  Neurological:     General: No focal deficit present.     Mental Status: She is alert and oriented to person, place, and time.  Psychiatric:        Mood and Affect: Mood normal.        Behavior: Behavior normal.    US  nl uterus, small posterior intramural fibroids, nl ovaries  Assessment/Plan: 37yo [id number] for RA TLH/BS, possible cystoscopy D/w  pt r/b/a, process and expectations Pt's post operative pain meds already prescribed. Pt's plan is for d/c DOS  Braxon Suder Bovard-Stuckert 11/30/2023, 7:45 PM

## 2023-12-01 ENCOUNTER — Encounter (HOSPITAL_COMMUNITY)
Admission: RE | Admit: 2023-12-01 | Discharge: 2023-12-01 | Disposition: A | Discharge status: Home / Self Care | Payer: BC Managed Care – PPO | Source: Ambulatory Visit | Attending: Obstetrics and Gynecology | Admitting: Obstetrics and Gynecology

## 2023-12-01 ENCOUNTER — Other Ambulatory Visit (HOSPITAL_COMMUNITY): Payer: BC Managed Care – PPO

## 2023-12-01 DIAGNOSIS — Z01812 Encounter for preprocedural laboratory examination: Secondary | ICD-10-CM | POA: Diagnosis not present

## 2023-12-01 DIAGNOSIS — Z01818 Encounter for other preprocedural examination: Secondary | ICD-10-CM

## 2023-12-01 LAB — COMPREHENSIVE METABOLIC PANEL
ALT: 25 U/L (ref 0–44)
AST: 22 U/L (ref 15–41)
Albumin: 4.2 g/dL (ref 3.5–5.0)
Alkaline Phosphatase: 62 U/L (ref 38–126)
Anion gap: 9 (ref 5–15)
BUN: 14 mg/dL (ref 6–20)
CO2: 21 mmol/L — ABNORMAL LOW (ref 22–32)
Calcium: 9 mg/dL (ref 8.9–10.3)
Chloride: 105 mmol/L (ref 98–111)
Creatinine, Ser: 0.71 mg/dL (ref 0.44–1.00)
GFR, Estimated: >60 mL/min (ref >60)
Glucose, Bld: 93 mg/dL (ref 70–99)
Potassium: 3.5 mmol/L (ref 3.5–5.1)
Sodium: 135 mmol/L (ref 135–145)
Total Bilirubin: 0.5 mg/dL (ref 0.0–1.2)
Total Protein: 7.5 g/dL (ref 6.5–8.1)

## 2023-12-01 LAB — CBC
HCT: 39.2 % (ref 36.0–46.0)
Hemoglobin: 12.9 g/dL (ref 12.0–15.0)
MCH: 30.3 pg (ref 26.0–34.0)
MCHC: 32.9 g/dL (ref 30.0–36.0)
MCV: 92 fL (ref 80.0–100.0)
Platelets: 306 10*3/uL (ref 150–400)
RBC: 4.26 MIL/uL (ref 3.87–5.11)
RDW: 12 % (ref 11.5–15.5)
WBC: 9.7 10*3/uL (ref 4.0–10.5)
nRBC: 0 % (ref 0.0–0.2)

## 2023-12-05 ENCOUNTER — Ambulatory Visit (HOSPITAL_BASED_OUTPATIENT_CLINIC_OR_DEPARTMENT_OTHER): Payer: BC Managed Care – PPO | Admitting: Anesthesiology

## 2023-12-05 ENCOUNTER — Encounter (HOSPITAL_BASED_OUTPATIENT_CLINIC_OR_DEPARTMENT_OTHER): Payer: Self-pay | Admitting: Obstetrics and Gynecology

## 2023-12-05 ENCOUNTER — Encounter (HOSPITAL_BASED_OUTPATIENT_CLINIC_OR_DEPARTMENT_OTHER)
Admission: RE | Disposition: A | Discharge status: Home / Self Care | Payer: Self-pay | Source: Home / Self Care | Attending: Obstetrics and Gynecology

## 2023-12-05 ENCOUNTER — Observation Stay (HOSPITAL_BASED_OUTPATIENT_CLINIC_OR_DEPARTMENT_OTHER)
Admission: RE | Admit: 2023-12-05 | Discharge: 2023-12-05 | Disposition: A | Discharge status: Home / Self Care | Payer: BC Managed Care – PPO | Attending: Obstetrics and Gynecology | Admitting: Obstetrics and Gynecology

## 2023-12-05 ENCOUNTER — Other Ambulatory Visit: Payer: Self-pay

## 2023-12-05 DIAGNOSIS — N888 Other specified noninflammatory disorders of cervix uteri: Secondary | ICD-10-CM | POA: Diagnosis not present

## 2023-12-05 DIAGNOSIS — Z9889 Other specified postprocedural states: Secondary | ICD-10-CM

## 2023-12-05 DIAGNOSIS — Z01818 Encounter for other preprocedural examination: Secondary | ICD-10-CM

## 2023-12-05 DIAGNOSIS — D259 Leiomyoma of uterus, unspecified: Secondary | ICD-10-CM | POA: Diagnosis not present

## 2023-12-05 DIAGNOSIS — R102 Pelvic and perineal pain: Secondary | ICD-10-CM | POA: Diagnosis not present

## 2023-12-05 DIAGNOSIS — N8003 Adenomyosis of the uterus: Secondary | ICD-10-CM | POA: Diagnosis not present

## 2023-12-05 HISTORY — DX: Adenomyosis of the uterus: N80.03

## 2023-12-05 HISTORY — DX: Rheumatoid arthritis, unspecified: M06.9

## 2023-12-05 HISTORY — DX: Personal history of diseases of the blood and blood-forming organs and certain disorders involving the immune mechanism: Z86.2

## 2023-12-05 HISTORY — PX: ROBOTIC ASSISTED LAPAROSCOPIC HYSTERECTOMY AND SALPINGECTOMY: SHX6379

## 2023-12-05 HISTORY — DX: Presence of spectacles and contact lenses: Z97.3

## 2023-12-05 HISTORY — PX: ROBOTIC ASSISTED LAPAROSCOPIC LYSIS OF ADHESION: SHX6080

## 2023-12-05 HISTORY — DX: Anemia, unspecified: D64.9

## 2023-12-05 LAB — BASIC METABOLIC PANEL
Anion gap: 8 (ref 5–15)
BUN: 16 mg/dL (ref 6–20)
CO2: 22 mmol/L (ref 22–32)
Calcium: 8.6 mg/dL — ABNORMAL LOW (ref 8.9–10.3)
Chloride: 105 mmol/L (ref 98–111)
Creatinine, Ser: 0.75 mg/dL (ref 0.44–1.00)
GFR, Estimated: >60 mL/min (ref >60)
Glucose, Bld: 175 mg/dL — ABNORMAL HIGH (ref 70–99)
Potassium: 3.7 mmol/L (ref 3.5–5.1)
Sodium: 135 mmol/L (ref 135–145)

## 2023-12-05 LAB — POCT PREGNANCY, URINE: Preg Test, Ur: NEGATIVE

## 2023-12-05 LAB — CBC
HCT: 34.1 % — ABNORMAL LOW (ref 36.0–46.0)
Hemoglobin: 11.2 g/dL — ABNORMAL LOW (ref 12.0–15.0)
MCH: 30.2 pg (ref 26.0–34.0)
MCHC: 32.8 g/dL (ref 30.0–36.0)
MCV: 91.9 fL (ref 80.0–100.0)
Platelets: 249 10*3/uL (ref 150–400)
RBC: 3.71 MIL/uL — ABNORMAL LOW (ref 3.87–5.11)
RDW: 11.9 % (ref 11.5–15.5)
WBC: 17.9 10*3/uL — ABNORMAL HIGH (ref 4.0–10.5)
nRBC: 0 % (ref 0.0–0.2)

## 2023-12-05 LAB — TYPE AND SCREEN
ABO/RH(D): A POS
Antibody Screen: NEGATIVE

## 2023-12-05 SURGERY — XI ROBOTIC ASSISTED LAPAROSCOPIC HYSTERECTOMY AND SALPINGECTOMY
Anesthesia: General | Site: Uterus | Laterality: Bilateral

## 2023-12-05 MED ORDER — OXYCODONE HCL 5 MG PO TABS: 5.0000 mg | ORAL_TABLET | Freq: Once | ORAL | Status: DC | PRN

## 2023-12-05 MED ORDER — LACTATED RINGERS IV SOLN
INTRAVENOUS | Status: DC
Start: 2023-12-05 — End: 2023-12-05

## 2023-12-05 MED ORDER — MIDAZOLAM HCL 2 MG/2ML IJ SOLN
INTRAMUSCULAR | Status: AC
Start: 2023-12-05 — End: ?
  Filled 2023-12-05: qty 2

## 2023-12-05 MED ORDER — OXYCODONE-ACETAMINOPHEN 5-325 MG PO TABS
ORAL_TABLET | ORAL | Status: AC
  Filled 2023-12-05: qty 1

## 2023-12-05 MED ORDER — SUGAMMADEX SODIUM 200 MG/2ML IV SOLN
INTRAVENOUS | Status: DC | PRN
  Administered 2023-12-05: 160 mg via INTRAVENOUS

## 2023-12-05 MED ORDER — AMPHETAMINE-DEXTROAMPHET ER 15 MG PO CP24: 15.0000 mg | ORAL_CAPSULE | ORAL | Status: DC

## 2023-12-05 MED ORDER — PROPOFOL 10 MG/ML IV BOLUS
INTRAVENOUS | Status: DC | PRN
  Administered 2023-12-05: 150 mg via INTRAVENOUS

## 2023-12-05 MED ORDER — KETOROLAC TROMETHAMINE 30 MG/ML IJ SOLN
INTRAMUSCULAR | Status: DC | PRN
  Administered 2023-12-05: 30 mg via INTRAVENOUS

## 2023-12-05 MED ORDER — ONDANSETRON HCL 4 MG/2ML IJ SOLN
INTRAMUSCULAR | Status: DC | PRN
  Administered 2023-12-05: 4 mg via INTRAVENOUS

## 2023-12-05 MED ORDER — CEFAZOLIN SODIUM-DEXTROSE 2-4 GM/100ML-% IV SOLN
2.0000 g | INTRAVENOUS | Status: AC
  Administered 2023-12-05: 2 g via INTRAVENOUS

## 2023-12-05 MED ORDER — ONDANSETRON HCL 4 MG/2ML IJ SOLN
INTRAMUSCULAR | Status: AC
  Filled 2023-12-05: qty 2

## 2023-12-05 MED ORDER — LIDOCAINE 2% (20 MG/ML) 5 ML SYRINGE
INTRAMUSCULAR | Status: DC | PRN
  Administered 2023-12-05: 50 mg via INTRAVENOUS

## 2023-12-05 MED ORDER — KETOROLAC TROMETHAMINE 30 MG/ML IJ SOLN
INTRAMUSCULAR | Status: AC
  Filled 2023-12-05: qty 1

## 2023-12-05 MED ORDER — FENTANYL CITRATE (PF) 100 MCG/2ML IJ SOLN
INTRAMUSCULAR | Status: DC | PRN
  Administered 2023-12-05 (×2): 50 ug via INTRAVENOUS
  Administered 2023-12-05: 100 ug via INTRAVENOUS
  Administered 2023-12-05: 50 ug via INTRAVENOUS

## 2023-12-05 MED ORDER — ACETAMINOPHEN 500 MG PO TABS
1000.0000 mg | ORAL_TABLET | ORAL | Status: AC
  Administered 2023-12-05: 1000 mg via ORAL

## 2023-12-05 MED ORDER — ALUM & MAG HYDROXIDE-SIMETH 200-200-20 MG/5ML PO SUSP: 30.0000 mL | ORAL | Status: DC | PRN

## 2023-12-05 MED ORDER — ROCURONIUM BROMIDE 10 MG/ML (PF) SYRINGE
PREFILLED_SYRINGE | INTRAVENOUS | Status: DC | PRN
  Administered 2023-12-05 (×2): 20 mg via INTRAVENOUS
  Administered 2023-12-05: 50 mg via INTRAVENOUS

## 2023-12-05 MED ORDER — ROCURONIUM BROMIDE 10 MG/ML (PF) SYRINGE
PREFILLED_SYRINGE | INTRAVENOUS | Status: AC
  Filled 2023-12-05: qty 10

## 2023-12-05 MED ORDER — SUMATRIPTAN SUCCINATE 50 MG PO TABS: 50.0000 mg | ORAL_TABLET | ORAL | Status: DC | PRN

## 2023-12-05 MED ORDER — MENTHOL 3 MG MT LOZG: 1.0000 | LOZENGE | OROMUCOSAL | Status: DC | PRN

## 2023-12-05 MED ORDER — LACTATED RINGERS IV SOLN: INTRAVENOUS | Status: DC

## 2023-12-05 MED ORDER — GUAIFENESIN 100 MG/5ML PO LIQD: 15.0000 mL | ORAL | Status: DC | PRN

## 2023-12-05 MED ORDER — DEXAMETHASONE SODIUM PHOSPHATE 10 MG/ML IJ SOLN
INTRAMUSCULAR | Status: AC
Start: 2023-12-05 — End: ?
  Filled 2023-12-05: qty 1

## 2023-12-05 MED ORDER — GABAPENTIN 300 MG PO CAPS
ORAL_CAPSULE | ORAL | Status: AC
  Filled 2023-12-05: qty 1

## 2023-12-05 MED ORDER — GABAPENTIN 300 MG PO CAPS
300.0000 mg | ORAL_CAPSULE | ORAL | Status: AC
  Administered 2023-12-05: 300 mg via ORAL

## 2023-12-05 MED ORDER — OXYCODONE-ACETAMINOPHEN 5-325 MG PO TABS
ORAL_TABLET | ORAL | Status: AC
  Filled 2023-12-05: qty 2

## 2023-12-05 MED ORDER — OXYCODONE-ACETAMINOPHEN 5-325 MG PO TABS
1.0000 | ORAL_TABLET | ORAL | Status: DC | PRN
  Administered 2023-12-05: 1 via ORAL
  Administered 2023-12-05: 2 via ORAL

## 2023-12-05 MED ORDER — HYDROMORPHONE HCL 1 MG/ML IJ SOLN: 0.2000 mg | INTRAMUSCULAR | Status: DC | PRN

## 2023-12-05 MED ORDER — DEXAMETHASONE SODIUM PHOSPHATE 10 MG/ML IJ SOLN
INTRAMUSCULAR | Status: DC | PRN
  Administered 2023-12-05: 10 mg via INTRAVENOUS

## 2023-12-05 MED ORDER — PHENYLEPHRINE 80 MCG/ML (10ML) SYRINGE FOR IV PUSH (FOR BLOOD PRESSURE SUPPORT)
PREFILLED_SYRINGE | INTRAVENOUS | Status: DC | PRN
  Administered 2023-12-05: 80 ug via INTRAVENOUS

## 2023-12-05 MED ORDER — SIMETHICONE 80 MG PO CHEW
80.0000 mg | CHEWABLE_TABLET | Freq: Four times a day (QID) | ORAL | Status: DC | PRN
Start: 2023-12-05 — End: 2023-12-05

## 2023-12-05 MED ORDER — SERTRALINE HCL 50 MG PO TABS: 50.0000 mg | ORAL_TABLET | Freq: Every day | ORAL | Status: DC

## 2023-12-05 MED ORDER — BUPIVACAINE HCL (PF) 0.25 % IJ SOLN
INTRAMUSCULAR | Status: DC | PRN
  Administered 2023-12-05: 15 mL

## 2023-12-05 MED ORDER — LIDOCAINE HCL (PF) 2 % IJ SOLN
INTRAMUSCULAR | Status: AC
  Filled 2023-12-05: qty 5

## 2023-12-05 MED ORDER — SODIUM CHLORIDE 0.9 % IR SOLN
Status: DC | PRN
  Administered 2023-12-05: 1

## 2023-12-05 MED ORDER — IBUPROFEN 800 MG PO TABS
800.0000 mg | ORAL_TABLET | Freq: Three times a day (TID) | ORAL | Status: DC | PRN
Start: 2023-12-05 — End: 2023-12-05

## 2023-12-05 MED ORDER — FENTANYL CITRATE (PF) 250 MCG/5ML IJ SOLN
INTRAMUSCULAR | Status: AC
  Filled 2023-12-05: qty 5

## 2023-12-05 MED ORDER — CEFAZOLIN SODIUM-DEXTROSE 2-4 GM/100ML-% IV SOLN
INTRAVENOUS | Status: AC
  Filled 2023-12-05: qty 100

## 2023-12-05 MED ORDER — FENTANYL CITRATE (PF) 100 MCG/2ML IJ SOLN
INTRAMUSCULAR | Status: AC
  Filled 2023-12-05: qty 2

## 2023-12-05 MED ORDER — MIDAZOLAM HCL 5 MG/5ML IJ SOLN
INTRAMUSCULAR | Status: DC | PRN
  Administered 2023-12-05: 2 mg via INTRAVENOUS

## 2023-12-05 MED ORDER — FENTANYL CITRATE (PF) 100 MCG/2ML IJ SOLN
25.0000 ug | INTRAMUSCULAR | Status: DC | PRN
  Administered 2023-12-05 (×3): 25 ug via INTRAVENOUS

## 2023-12-05 MED ORDER — PHENYLEPHRINE 80 MCG/ML (10ML) SYRINGE FOR IV PUSH (FOR BLOOD PRESSURE SUPPORT)
PREFILLED_SYRINGE | INTRAVENOUS | Status: AC
  Filled 2023-12-05: qty 10

## 2023-12-05 MED ORDER — DEXMEDETOMIDINE HCL IN NACL 80 MCG/20ML IV SOLN
INTRAVENOUS | Status: DC | PRN
  Administered 2023-12-05: 8 ug via INTRAVENOUS

## 2023-12-05 MED ORDER — PROPOFOL 500 MG/50ML IV EMUL
INTRAVENOUS | Status: AC
  Filled 2023-12-05: qty 50

## 2023-12-05 MED ORDER — ONDANSETRON HCL 4 MG/2ML IJ SOLN
4.0000 mg | Freq: Four times a day (QID) | INTRAMUSCULAR | Status: AC | PRN
  Administered 2023-12-05: 4 mg via INTRAVENOUS

## 2023-12-05 MED ORDER — ARTIFICIAL TEARS OPHTHALMIC OINT
TOPICAL_OINTMENT | OPHTHALMIC | Status: AC
Start: 2023-12-05 — End: ?
  Filled 2023-12-05: qty 3.5

## 2023-12-05 MED ORDER — DEXMEDETOMIDINE HCL IN NACL 80 MCG/20ML IV SOLN
INTRAVENOUS | Status: AC
  Filled 2023-12-05: qty 20

## 2023-12-05 MED ORDER — POVIDONE-IODINE 10 % EX SWAB: 2.0000 | Freq: Once | CUTANEOUS | Status: DC

## 2023-12-05 MED ORDER — OXYCODONE HCL 5 MG/5ML PO SOLN: 5.0000 mg | Freq: Once | ORAL | Status: DC | PRN

## 2023-12-05 MED ORDER — ONDANSETRON HCL 4 MG PO TABS: 4.0000 mg | ORAL_TABLET | Freq: Four times a day (QID) | ORAL | Status: DC | PRN

## 2023-12-05 MED ORDER — ACETAMINOPHEN 500 MG PO TABS
ORAL_TABLET | ORAL | Status: AC
  Filled 2023-12-05: qty 2

## 2023-12-05 MED ORDER — ONDANSETRON HCL 4 MG/2ML IJ SOLN
4.0000 mg | Freq: Four times a day (QID) | INTRAMUSCULAR | Status: DC | PRN
  Administered 2023-12-05: 4 mg via INTRAVENOUS

## 2023-12-05 MED ORDER — HYDROXYCHLOROQUINE SULFATE 200 MG PO TABS: 300.0000 mg | ORAL_TABLET | Freq: Every day | ORAL | Status: DC

## 2023-12-05 SURGICAL SUPPLY — 43 items
CANNULA CAP OBTURATR AIRSEAL 8 (CAP) ×3 IMPLANT
CATH FOLEY 3WAY 5CC 16FR (CATHETERS) ×3 IMPLANT
COVER BACK TABLE 60X90IN (DRAPES) ×3 IMPLANT
COVER TIP SHEARS 8 DVNC (MISCELLANEOUS) ×3 IMPLANT
DEFOGGER SCOPE WARMER CLEARIFY (MISCELLANEOUS) ×3 IMPLANT
DERMABOND ADVANCED .7 DNX12 (GAUZE/BANDAGES/DRESSINGS) ×3 IMPLANT
DRAPE ARM DVNC X/XI (DISPOSABLE) ×12 IMPLANT
DRAPE COLUMN DVNC XI (DISPOSABLE) ×3 IMPLANT
DRAPE SURG IRRIG POUCH 19X23 (DRAPES) ×3 IMPLANT
DRAPE UTILITY XL STRL (DRAPES) ×3 IMPLANT
DRIVER NDL MEGA SUTCUT DVNCXI (INSTRUMENTS) ×2 IMPLANT
DRIVER NDLE MEGA SUTCUT DVNCXI (INSTRUMENTS) ×3
DURAPREP 26ML APPLICATOR (WOUND CARE) ×3 IMPLANT
ELECT REM PT RETURN 9FT ADLT (ELECTROSURGICAL) ×3
ELECTRODE REM PT RTRN 9FT ADLT (ELECTROSURGICAL) ×2 IMPLANT
FORCEPS BPLR FENES DVNC XI (FORCEP) ×3 IMPLANT
GAUZE 4X4 16PLY ~~LOC~~+RFID DBL (SPONGE) ×3 IMPLANT
GLOVE BIO SURGEON STRL SZ 6.5 (GLOVE) ×9 IMPLANT
HOLDER FOLEY CATH W/STRAP (MISCELLANEOUS) ×1 IMPLANT
IRRIG SUCT STRYKERFLOW 2 WTIP (MISCELLANEOUS) ×3
IRRIGATION SUCT STRKRFLW 2 WTP (MISCELLANEOUS) ×2 IMPLANT
KIT PINK PAD W/HEAD ARE REST (MISCELLANEOUS) ×3
KIT PINK PAD W/HEAD ARM REST (MISCELLANEOUS) ×2 IMPLANT
KIT TURNOVER CYSTO (KITS) ×3 IMPLANT
LEGGING LITHOTOMY PAIR STRL (DRAPES) ×3 IMPLANT
MANIFOLD NEPTUNE II (INSTRUMENTS) ×3 IMPLANT
MANIPULATOR ADVINCU DEL 3.0 PL (MISCELLANEOUS) ×1 IMPLANT
NDL INSUFFLATION 14GA 120MM (NEEDLE) ×2 IMPLANT
NEEDLE INSUFFLATION 14GA 120MM (NEEDLE) ×3
PACK ROBOT WH (CUSTOM PROCEDURE TRAY) ×3 IMPLANT
PACK ROBOTIC GOWN (GOWN DISPOSABLE) ×3 IMPLANT
PAD OB MATERNITY 4.3X12.25 (PERSONAL CARE ITEMS) ×3 IMPLANT
PAD PREP 24X48 CUFFED NSTRL (MISCELLANEOUS) ×3 IMPLANT
PROTECTOR NERVE ULNAR (MISCELLANEOUS) ×3 IMPLANT
SCISSORS MNPLR CVD DVNC XI (INSTRUMENTS) ×4 IMPLANT
SEAL UNIV 5-12 XI (MISCELLANEOUS) ×6 IMPLANT
SEALER VESSEL EXT DVNC XI (MISCELLANEOUS) ×1 IMPLANT
SET TUBE FILTERED XL AIRSEAL (SET/KITS/TRAYS/PACK) ×3 IMPLANT
SLEEVE SCD COMPRESS KNEE MED (STOCKING) ×3 IMPLANT
SUT VIC AB 4-0 PS2 18 (SUTURE) ×5 IMPLANT
SUT VLOC 180 0 9IN GS21 (SUTURE) ×3 IMPLANT
TOWEL OR 17X24 6PK STRL BLUE (TOWEL DISPOSABLE) ×3 IMPLANT
WATER STERILE IRR 500ML POUR (IV SOLUTION) ×1 IMPLANT

## 2023-12-05 NOTE — Brief Op Note (Signed)
 12/05/2023  9:41 AM  PATIENT:  Colleen [name]  38 y.o. female  PRE-OPERATIVE DIAGNOSIS:  Uterine adenomyosis  POST-OPERATIVE DIAGNOSIS:  Uterine adenomyosis  PROCEDURE:  Procedure(s): XI ROBOTIC ASSISTED LAPAROSCOPIC HYSTERECTOMY AND BILATERAL SALPINGECTOMY (Bilateral), LOA  SURGEON:  Surgeons and Role:    * Bovard-Stuckert, Treysean Petruzzi, MD - Primary  ASSISTANTS: Krietemeyer, Heather RNFA   ANESTHESIA:   local and general  EBL:  75 mL IVF per anesthesia, UOP 50cc clear urine at end of procedure  DRAINS: Urinary Catheter (Foley)   LOCAL MEDICATIONS USED:  MARCAINE      SPECIMEN:  Source of Specimen:  uterus, cervix, B fallopian tubes  DISPOSITION OF SPECIMEN:  PATHOLOGY  COUNTS:  YES  TOURNIQUET:  * No tourniquets in log *  DICTATION: .Other Dictation: Dictation Number [id number]  PLAN OF CARE:  extended recovery  PATIENT DISPOSITION:  PACU - hemodynamically stable.   Delay start of Pharmacological VTE agent (>24hrs) due to surgical blood loss or risk of bleeding: not applicable

## 2023-12-05 NOTE — Anesthesia Preprocedure Evaluation (Signed)
 Anesthesia Evaluation  Patient identified by MRN, date of birth, ID band Patient awake    Reviewed: Allergy & Precautions, H&P , NPO status , Patient's Chart, lab work & pertinent test results  Airway Mallampati: II   Neck ROM: full    Dental   Pulmonary neg pulmonary ROS   breath sounds clear to auscultation       Cardiovascular negative cardio ROS  Rhythm:regular Rate:Normal     Neuro/Psych  Headaches PSYCHIATRIC DISORDERS  Depression       GI/Hepatic   Endo/Other    Renal/GU      Musculoskeletal  (+) Arthritis , Rheumatoid disorders,    Abdominal   Peds  Hematology   Anesthesia Other Findings   Reproductive/Obstetrics                             Anesthesia Physical Anesthesia Plan  ASA: 2  Anesthesia Plan: General   Post-op Pain Management:    Induction: Intravenous  PONV Risk Score and Plan: 3 and Ondansetron , Dexamethasone , Midazolam  and Treatment may vary due to age or medical condition  Airway Management Planned: Oral ETT  Additional Equipment:   Intra-op Plan:   Post-operative Plan: Extubation in OR  Informed Consent: I have reviewed the patients History and Physical, chart, labs and discussed the procedure including the risks, benefits and alternatives for the proposed anesthesia with the patient or authorized representative who has indicated his/her understanding and acceptance.     Dental advisory given  Plan Discussed with: CRNA, Anesthesiologist and Surgeon  Anesthesia Plan Comments:        Anesthesia Quick Evaluation

## 2023-12-05 NOTE — Interval H&P Note (Signed)
 History and Physical Interval Note:  12/05/2023 7:06 AM  Colleen [name]  has presented today for surgery, with the diagnosis of Uterine adenomyosis.  The various methods of treatment have been discussed with the patient and family. After consideration of risks, benefits and other options for treatment, the patient has consented to  Procedure(s): XI ROBOTIC ASSISTED LAPAROSCOPIC HYSTERECTOMY AND SALPINGECTOMY (Bilateral) POSSIBLE CYSTOSCOPY (N/A) as a surgical intervention.  The patient's history has been reviewed, patient examined, no change in status, stable for surgery.  I have reviewed the patient's chart and labs.  Questions were answered to the patient's satisfaction.     [name]

## 2023-12-05 NOTE — Anesthesia Procedure Notes (Signed)
 Procedure Name: Intubation Date/Time: 12/05/2023 7:43 AM  Performed by: Alejo Delon DASEN, CRNAPre-anesthesia Checklist: Patient identified, Emergency Drugs available, Suction available and Patient being monitored Patient Re-evaluated:Patient Re-evaluated prior to induction Oxygen Delivery Method: Circle system utilized Preoxygenation: Pre-oxygenation with 100% oxygen Induction Type: IV induction Ventilation: Mask ventilation without difficulty Laryngoscope Size: Mac and 3 Grade View: Grade I Tube type: Oral Tube size: 7.0 mm Number of attempts: 1 Airway Equipment and Method: Stylet Placement Confirmation: ETT inserted through vocal cords under direct vision, positive ETCO2 and breath sounds checked- equal and bilateral Secured at: 19 cm Tube secured with: Tape Dental Injury: Teeth and Oropharynx as per pre-operative assessment

## 2023-12-05 NOTE — Op Note (Signed)
 NAME: Colleen [name], Colleen [name]. MEDICAL RECORD NO: [id number] ACCOUNT NO: [id number] DATE OF BIRTH: 1986/04/14 FACILITY: [facility] LOCATION: [facility] PHYSICIAN: Ezzie Buba, MD  Operative Report   DATE OF PROCEDURE: 12/05/2023  PREOPERATIVE DIAGNOSES: 1.  Uterine adenomyosis. 2.  Pelvic pain.  POSTOPERATIVE DIAGNOSES: 1.  Uterine adenomyosis. 2.  Pelvic pain, status post robot-assisted total laparoscopic hysterectomy with bilateral salpingectomy.  PROCEDURE:  Da Vinci robot-assisted total laparoscopic hysterectomy with bilateral salpingectomy, also, lysis of adhesions.  SURGEON:  Ezzie Buba, MD  ASSISTANT:  Powell Hoops, RNFA.   ANESTHESIA:  Local and general.  ESTIMATED BLOOD LOSS:  75 mL.  IV FLUIDS:  Per anesthesia.  URINE OUTPUT:  50 mL clear urine at the end of the procedure.  COMPLICATIONS:  None.  PATHOLOGY:  Uterus, cervix, and bilateral fallopian tubes to pathology.  DESCRIPTION OF PROCEDURE:  After informed consent was reviewed with the patient including risks, benefits, and alternatives of the surgical procedure, she was transported to the operating room and placed on the table in supine position. General  anesthesia was induced and found to be adequate. She was then placed in the Y[place]in stirrups, prepped and draped in the normal sterile fashion. After an appropriate time-out was performed, Advincula retractor was placed in her uterus. Foley catheter was  also placed. Gown and gloves were changed. After the patient was appropriately prepped and draped and an appropriate time-out was performed, an 8-mm supraumbilical incision was made, carried through to the underlying layer of fascia. The fascia was  cleared with a hemostat using a Veress needle. Pneumoperitoneum was obtained after passing the hanging drop test with an opening pressure of 1 mmHg.  The umbilical trocar was then placed under direct visualization. Accessory ports were placed on both  the  right and left under direct visualization as well. To visualize the uterus and cervix, an area of the bowel was scarred to the anterior abdominal wall the patient had previously had a cesarean section, this was reduced with the vessel sealer in the  omentum and into the anterior abdominal wall. The uterus and tubes were easily visualized.  The tube was grasped on the left at the fimbriated end and excised to the level of the cornu using the vessel sealer.  The utero-ovarian ligament was ligated. The  round ligament was ligated and the cardinal ligaments were ligated to the level of the uterine artery.  The uterine artery was also ligated. The bladder flap was created to the midline. Attention was then turned to the right side, which in a similar  fashion the right end of the fimbriated tube was grasped and the tube was excised to the level of the cornu using the vessel sealer and utero-ovarian ligament was ligated. The round ligament was ligated. The cardinal ligaments to the level of the uterine  artery were ligated.  Bladder probe was difficult to create from the right side. The decision was made after the uterine artery was ligated to enter posteriorly. The cup was easily palpated and using scissors, the colpotomy was performed.  The uterus  was delivered through the vagina. Hemostasis was assured with cautery. Some irrigation was performed.  Oozing was controlled.  The vaginal incision was closed with 0 V-Loc suture. The ureters were confidently identified multiple times through the  procedure. The instruments were removed from the abdomen.  The vagina was inspected.  The cuff was found to be intact.  The trocars were removed and closed with 4-0 Vicryl and Dermabond. The patient tolerated the  procedure well. Sponge, lap and needle  counts were correct x2 per the operating staff.       PAA [name]: 12/05/2023 9:49:45 am T: 12/05/2023 10:35:00 am  JOB: [id number]/ [id number]

## 2023-12-05 NOTE — Transfer of Care (Signed)
 Immediate Anesthesia Transfer of Care Note  Patient: Colleen [name]  Procedure(s) Performed: XI ROBOTIC ASSISTED LAPAROSCOPIC HYSTERECTOMY AND BILATERAL SALPINGECTOMY (Bilateral: Uterus) XI ROBOTIC ASSISTED LAPAROSCOPIC LYSIS OF ADHESION (Abdomen)  Patient Location: PACU  Anesthesia Type:General  Level of Consciousness: drowsy  Airway & Oxygen Therapy: Patient Spontanous Breathing and Patient connected to nasal cannula oxygen  Post-op Assessment: Report given to RN  Post vital signs: Reviewed and stable  Last Vitals:  Vitals Value Taken Time  BP 120/73 12/05/23 0948  Temp    Pulse 74 12/05/23 0950  Resp 18 12/05/23 0950  SpO2 100 % 12/05/23 0950  Vitals shown include unfiled device data.  Last Pain:  Vitals:   12/05/23 0605  TempSrc: Oral  PainSc: 0-No pain      Patients Stated Pain Goal: 5 (12/05/23 9394)  Complications: No notable events documented.

## 2023-12-05 NOTE — Anesthesia Postprocedure Evaluation (Signed)
 Anesthesia Post Note  Patient: Colleen [name]  Procedure(s) Performed: XI ROBOTIC ASSISTED LAPAROSCOPIC HYSTERECTOMY AND BILATERAL SALPINGECTOMY (Bilateral: Uterus) XI ROBOTIC ASSISTED LAPAROSCOPIC LYSIS OF ADHESION (Abdomen)     Patient location during evaluation: PACU Anesthesia Type: General Level of consciousness: awake and alert Pain management: pain level controlled Vital Signs Assessment: post-procedure vital signs reviewed and stable Respiratory status: spontaneous breathing, nonlabored ventilation, respiratory function stable and patient connected to nasal cannula oxygen Cardiovascular status: blood pressure returned to baseline and stable Postop Assessment: no apparent nausea or vomiting Anesthetic complications: no   No notable events documented.  Last Vitals:  Vitals:   12/05/23 1045 12/05/23 1115  BP: 112/70 112/67  Pulse: 70 75  Resp: 16 16  Temp:  36.7 C  SpO2: 99% 100%    Last Pain:  Vitals:   12/05/23 1115  TempSrc:   PainSc: 5                  Olliver Boyadjian S

## 2023-12-05 NOTE — Progress Notes (Signed)
*   Day of Surgery * Procedure(s) (LRB): XI ROBOTIC ASSISTED LAPAROSCOPIC HYSTERECTOMY AND BILATERAL SALPINGECTOMY (Bilateral) XI ROBOTIC ASSISTED LAPAROSCOPIC LYSIS OF ADHESION  Subjective: Patient reports nausea, incisional pain, tolerating PO, and no problems voiding.  Ambulating.    Objective: I have reviewed patient's vital signs, intake and output, medications, and labs.  General: alert and no distress Resp: clear to auscultation bilaterally Cardio: regular rate and rhythm GI: soft, non-tender; bowel sounds normal; no masses,  no organomegaly Extremities: extremities normal, atraumatic, no cyanosis or edema Inc C/D/I  Assessment: s/p Procedure(s): XI ROBOTIC ASSISTED LAPAROSCOPIC HYSTERECTOMY AND BILATERAL SALPINGECTOMY (Bilateral) XI ROBOTIC ASSISTED LAPAROSCOPIC LYSIS OF ADHESION: stable and progressing well  Plan: Encourage ambulation Advance to PO medication Discharge home with percocet and motrin  F/u 2 and 6 weeks Labs stable  LOS: 0 days    Ezzie Buba, MD 12/05/2023, 4:13 PM

## 2023-12-06 ENCOUNTER — Encounter (HOSPITAL_BASED_OUTPATIENT_CLINIC_OR_DEPARTMENT_OTHER): Payer: Self-pay | Admitting: Obstetrics and Gynecology

## 2023-12-06 LAB — SURGICAL PATHOLOGY

## 2023-12-09 DIAGNOSIS — F900 Attention-deficit hyperactivity disorder, predominantly inattentive type: Secondary | ICD-10-CM | POA: Diagnosis not present

## 2023-12-20 DIAGNOSIS — R300 Dysuria: Secondary | ICD-10-CM | POA: Diagnosis not present

## 2023-12-21 ENCOUNTER — Other Ambulatory Visit: Payer: Self-pay

## 2023-12-21 ENCOUNTER — Emergency Department (HOSPITAL_BASED_OUTPATIENT_CLINIC_OR_DEPARTMENT_OTHER)
Admission: EM | Admit: 2023-12-21 | Discharge: 2023-12-21 | Disposition: A | Discharge status: Home / Self Care | Payer: BC Managed Care – PPO | Attending: Emergency Medicine | Admitting: Emergency Medicine

## 2023-12-21 ENCOUNTER — Encounter (HOSPITAL_BASED_OUTPATIENT_CLINIC_OR_DEPARTMENT_OTHER): Payer: Self-pay

## 2023-12-21 ENCOUNTER — Emergency Department (HOSPITAL_BASED_OUTPATIENT_CLINIC_OR_DEPARTMENT_OTHER): Payer: BC Managed Care – PPO | Admitting: Radiology

## 2023-12-21 ENCOUNTER — Emergency Department (HOSPITAL_BASED_OUTPATIENT_CLINIC_OR_DEPARTMENT_OTHER): Payer: BC Managed Care – PPO

## 2023-12-21 DIAGNOSIS — R509 Fever, unspecified: Secondary | ICD-10-CM | POA: Diagnosis not present

## 2023-12-21 DIAGNOSIS — R109 Unspecified abdominal pain: Secondary | ICD-10-CM | POA: Diagnosis not present

## 2023-12-21 DIAGNOSIS — N3000 Acute cystitis without hematuria: Secondary | ICD-10-CM | POA: Diagnosis not present

## 2023-12-21 DIAGNOSIS — R102 Pelvic and perineal pain: Secondary | ICD-10-CM | POA: Diagnosis not present

## 2023-12-21 DIAGNOSIS — Z20822 Contact with and (suspected) exposure to covid-19: Secondary | ICD-10-CM | POA: Insufficient documentation

## 2023-12-21 LAB — URINALYSIS, ROUTINE W REFLEX MICROSCOPIC
Bacteria, UA: NONE SEEN
Bilirubin Urine: NEGATIVE
Glucose, UA: NEGATIVE mg/dL
Ketones, ur: NEGATIVE mg/dL
Nitrite: POSITIVE — AB
Protein, ur: NEGATIVE mg/dL
Specific Gravity, Urine: 1.014 (ref 1.005–1.030)
pH: 5.5 (ref 5.0–8.0)

## 2023-12-21 LAB — RESP PANEL BY RT-PCR (RSV, FLU A&B, COVID)  RVPGX2
Influenza A by PCR: NEGATIVE
Influenza B by PCR: NEGATIVE
Resp Syncytial Virus by PCR: NEGATIVE
SARS Coronavirus 2 by RT PCR: NEGATIVE

## 2023-12-21 LAB — CBC
HCT: 34.3 % — ABNORMAL LOW (ref 36.0–46.0)
Hemoglobin: 11.6 g/dL — ABNORMAL LOW (ref 12.0–15.0)
MCH: 30.6 pg (ref 26.0–34.0)
MCHC: 33.8 g/dL (ref 30.0–36.0)
MCV: 90.5 fL (ref 80.0–100.0)
Platelets: 360 10*3/uL (ref 150–400)
RBC: 3.79 MIL/uL — ABNORMAL LOW (ref 3.87–5.11)
RDW: 11.9 % (ref 11.5–15.5)
WBC: 12 10*3/uL — ABNORMAL HIGH (ref 4.0–10.5)
nRBC: 0 % (ref 0.0–0.2)

## 2023-12-21 LAB — COMPREHENSIVE METABOLIC PANEL
ALT: 147 U/L — ABNORMAL HIGH (ref 0–44)
AST: 42 U/L — ABNORMAL HIGH (ref 15–41)
Albumin: 4 g/dL (ref 3.5–5.0)
Alkaline Phosphatase: 129 U/L — ABNORMAL HIGH (ref 38–126)
Anion gap: 7 (ref 5–15)
BUN: 11 mg/dL (ref 6–20)
CO2: 28 mmol/L (ref 22–32)
Calcium: 9.1 mg/dL (ref 8.9–10.3)
Chloride: 101 mmol/L (ref 98–111)
Creatinine, Ser: 0.8 mg/dL (ref 0.44–1.00)
GFR, Estimated: >60 mL/min (ref >60)
Glucose, Bld: 82 mg/dL (ref 70–99)
Potassium: 3.4 mmol/L — ABNORMAL LOW (ref 3.5–5.1)
Sodium: 136 mmol/L (ref 135–145)
Total Bilirubin: 0.4 mg/dL (ref 0.0–1.2)
Total Protein: 6.9 g/dL (ref 6.5–8.1)

## 2023-12-21 MED ORDER — SODIUM CHLORIDE 0.9 % IV SOLN
1.0000 g | Freq: Once | INTRAVENOUS | Status: AC
  Administered 2023-12-21: 1 g via INTRAVENOUS
  Filled 2023-12-21: qty 10

## 2023-12-21 MED ORDER — KETOROLAC TROMETHAMINE 15 MG/ML IJ SOLN
15.0000 mg | Freq: Once | INTRAMUSCULAR | Status: AC
  Administered 2023-12-21: 15 mg via INTRAVENOUS
  Filled 2023-12-21: qty 1

## 2023-12-21 MED ORDER — IOHEXOL 300 MG/ML  SOLN
100.0000 mL | Freq: Once | INTRAMUSCULAR | Status: AC | PRN
  Administered 2023-12-21: 85 mL via INTRAVENOUS

## 2023-12-21 MED ORDER — CEPHALEXIN 500 MG PO CAPS: 500.0000 mg | ORAL_CAPSULE | Freq: Two times a day (BID) | ORAL | 0 refills | Status: DC

## 2023-12-21 NOTE — ED Triage Notes (Signed)
Pt reports she is here today due to abd pain with fever. Pt reports she recently had an hysterectomy x2 weeks ago. Pt reports she followed up with her ob today due to the pain & fever and they sent her here for further eval.Pt reports fevers at home of 100.7.

## 2023-12-21 NOTE — ED Provider Notes (Signed)
[place] EMERGENCY DEPARTMENT AT [facility] Provider Note   CSN: [id number] Arrival date & time: 12/21/23  1946     History  Chief Complaint  Patient presents with   Abdominal Pain    Colleen [name] is a 38 y.o. female, history of hysterectomy, who presents to the ED secondary to hysterectomy done 2 weeks ago, by Dr. Bobbe Medico.  Hysterectomy was done on 1/6, she has had minimal pain, until 1 week ago, she started having severe pelvic pain.  She has had her vaginal cuff checked several times, and it has been well-appearing.  The pain has progressively gotten worse, and she called her OB/GYN today, as she has a fever, and was instructed to go to the ER.  She denies any urinary symptoms, but states is more painful when she is retaining urine, or having to have a bowel movement.  Denies any vaginal discharge, has been compliant with pelvic rest.  States at home, her fever was 100.6 F    Home Medications Prior to Admission medications   Medication Sig Start Date End Date Taking? Authorizing Provider  amphetamine-dextroamphetamine (ADDERALL XR) 15 MG 24 hr capsule Take 1 capsule by mouth every morning. 10/31/23   Sharlene Dory, DO  cephALEXin (KEFLEX) 500 MG capsule Take 1 capsule (500 mg total) by mouth 2 (two) times daily. 12/21/23  Yes Danaya Geddis L, PA  hydroxychloroquine (PLAQUENIL) 200 MG tablet Take 1.5 tablets (300 mg total) by mouth daily. Patient taking differently: Take 300 mg by mouth daily. 10/07/23   Fuller Plan, MD  ibuprofen (ADVIL) 800 MG tablet Take 800 mg by mouth 3 (three) times daily. Patient not taking: Reported on 11/25/2023 10/05/23   [provider]  Multiple Vitamin (MULTIVITAMIN WITH MINERALS) TABS tablet Take 1 tablet by mouth daily.    [provider]  oxyCODONE-acetaminophen (PERCOCET/ROXICET) 5-325 MG tablet Take 1 tablet by mouth 4 (four) times daily as needed. Patient not taking: Reported on 11/25/2023 10/05/23    [provider]  rizatriptan (MAXALT) 10 MG tablet Take 1 tablet (10 mg total) by mouth as needed for migraine. May repeat in 2 hours if needed 10/31/23   Sharlene Dory, DO  sertraline (ZOLOFT) 50 MG tablet Take 1 tablet (50 mg total) by mouth daily. 10/20/22   Sharlene Dory, DO      Allergies    Patient has no known allergies.    Review of Systems   Review of Systems  Gastrointestinal:  Positive for abdominal pain. Negative for nausea.    Physical Exam Updated Vital Signs BP (!) 105/58   Pulse 77   Temp 98.3 F (36.8 C) (Oral)   Resp 16   LMP 11/13/2023 (Exact Date)   SpO2 96%  Physical Exam Vitals and nursing note reviewed.  Constitutional:      General: She is not in acute distress.    Appearance: She is well-developed.  HENT:     Head: Normocephalic and atraumatic.  Eyes:     Conjunctiva/sclera: Conjunctivae normal.  Cardiovascular:     Rate and Rhythm: Normal rate and regular rhythm.     Heart sounds: No murmur heard. Pulmonary:     Effort: Pulmonary effort is normal. No respiratory distress.     Breath sounds: Normal breath sounds.  Abdominal:     Palpations: Abdomen is soft.     Tenderness: There is abdominal tenderness in the suprapubic area.  Musculoskeletal:        General: No swelling.  Cervical back: Neck supple.  Skin:    General: Skin is warm and dry.     Capillary Refill: Capillary refill takes less than 2 seconds.  Neurological:     Mental Status: She is alert.  Psychiatric:        Mood and Affect: Mood normal.     ED Results / Procedures / Treatments   Labs (all labs ordered are listed, but only abnormal results are displayed) Labs Reviewed  COMPREHENSIVE METABOLIC PANEL - Abnormal; Notable for the following components:      Result Value   Potassium 3.4 (*)    AST 42 (*)    ALT 147 (*)    Alkaline Phosphatase 129 (*)    All other components within normal limits  CBC - Abnormal; Notable for the following  components:   WBC 12.0 (*)    RBC 3.79 (*)    Hemoglobin 11.6 (*)    HCT 34.3 (*)    All other components within normal limits  URINALYSIS, ROUTINE W REFLEX MICROSCOPIC - Abnormal; Notable for the following components:   Hgb urine dipstick Colleen [name] (*)    Nitrite POSITIVE (*)    Leukocytes,Ua TRACE (*)    All other components within normal limits  RESP PANEL BY RT-PCR (RSV, FLU A&B, COVID)  RVPGX2    EKG None  Radiology DG Chest 2 View Result Date: 12/21/2023 CLINICAL DATA:  Fever EXAM: CHEST - 2 VIEW COMPARISON:  07/27/2023 FINDINGS: The heart size and mediastinal contours are within normal limits. Both lungs are clear. The visualized skeletal structures are unremarkable. IMPRESSION: Normal study. Electronically Signed   By: Charlett Nose M.D.   On: 12/21/2023 22:14   CT ABDOMEN PELVIS W CONTRAST Result Date: 12/21/2023 CLINICAL DATA:  Abdominal pain, fever.  Hysterectomy 2 weeks ago. EXAM: CT ABDOMEN AND PELVIS WITH CONTRAST TECHNIQUE: Multidetector CT imaging of the abdomen and pelvis was performed using the standard protocol following bolus administration of intravenous contrast. RADIATION DOSE REDUCTION: This exam was performed according to the departmental dose-optimization program which includes automated exposure control, adjustment of the mA and/or kV according to patient size and/or use of iterative reconstruction technique. CONTRAST:  85mL OMNIPAQUE IOHEXOL 300 MG/ML  SOLN COMPARISON:  None Available. FINDINGS: Lower chest: No acute abnormality Hepatobiliary: No focal hepatic abnormality. Gallbladder unremarkable. Pancreas: No focal abnormality or ductal dilatation. Spleen: No focal abnormality.  Normal size. Adrenals/Urinary Tract: No adrenal abnormality. No focal renal abnormality. No stones or hydronephrosis. Urinary bladder is unremarkable. Stomach/Bowel: Normal appendix. Stomach, large and Colleen [name] bowel grossly unremarkable. Vascular/Lymphatic: No evidence of aneurysm or adenopathy.  Reproductive: Changes of hysterectomy. There is stranding in the pelvis presumably related to recent hysterectomy. No focal fluid collection. No adnexal mass. Other: No free fluid or free air. Musculoskeletal: No acute bony abnormality. IMPRESSION: Changes of hysterectomy with stranding in the pelvis in the operative bed and surrounding the adjacent sigmoid colon. This is presumably related to hysterectomy. No drainable focal fluid collection. No diverticula in the sigmoid colon suggests this could be diverticulitis. Electronically Signed   By: Charlett Nose M.D.   On: 12/21/2023 21:26    Procedures Procedures    Medications Ordered in ED Medications  cefTRIAXone (ROCEPHIN) 1 g in sodium chloride 0.9 % 100 mL IVPB (has no administration in time range)  ketorolac (TORADOL) 15 MG/ML injection 15 mg (15 mg Intravenous Given 12/21/23 2013)  iohexol (OMNIPAQUE) 300 MG/ML solution 100 mL (85 mLs Intravenous Contrast Given 12/21/23 2112)    ED  Course/ Medical Decision Making/ A&P                                 Medical Decision Making Patient is a 38 year old female, here for fever of unknown origin, as well as worsening abdominal pain for the last week, she is 2 weeks postop, from a hysterectomy, and concerned she may have an infection.  Was told to go into the ER by her gynecologist for further evaluation.  Dr. Tempie Donning her gynecologist, recommends CT abdomen pelvis, and further workup.  I will give her Toradol for pain control.  She has suprapubic pain, but no other pain noted.  Amount and/or Complexity of Data Reviewed Labs: ordered.    Details: Mild leukocytosis of 12K, nitrate positive urine, mild transaminitis Radiology: ordered.    Details: CT abdomen pelvis shows postop findings, no other acute findings Discussion of management or test interpretation with external provider(s): Discussed with patient, her CT abdomen pelvis, is unremarkable, except for postoperative findings.  She does  have mild leukocytosis of 12K which is gone down from her initial surgery numbers.  She does have mild transaminitis, is possible that she has a viral illness, which is causing a little bit of bump of her LFTs, as well as the fever.  COVID/flu is pending at this time.  I discussed with Dr. Katherine Mantle, who requested a chest x-ray, COVID/flu swab, which chest x-ray was negative.  Will send patient home with antibiotics for possible urinary tract infection, as she has nitrate positive.  We discussed return precautions and she voiced understanding.  Risk Prescription drug management.    Final Clinical Impression(s) / ED Diagnoses Final diagnoses:  Pelvic pain  Acute cystitis without hematuria    Rx / DC Orders ED Discharge Orders          Ordered    cephALEXin (KEFLEX) 500 MG capsule  2 times daily        12/21/23 2233              Rosemaria Inabinet, Harley Alto, PA 12/21/23 2236    Estelle June A, DO 12/26/23 1145

## 2023-12-21 NOTE — ED Notes (Signed)
 RN reviewed discharge instructions with pt. Pt verbalized understanding and had no further questions. VSS upon discharge.  

## 2023-12-21 NOTE — Discharge Instructions (Addendum)
I believe that your pain may be secondary to urinary tract infection, versus postoperative pain.  Please follow-up with your gynecologist or your primary care doctor.  I prescribed you some antibiotics, to take for the next 7 days.  Please take them in completion even if you start feeling better.  Return if you have fever, chills, worsening symptoms.  Your COVID/flu is pending, you can follow-up on MyChart for your results

## 2023-12-21 NOTE — ED Notes (Signed)
PA at bedside.

## 2023-12-23 ENCOUNTER — Telehealth: Payer: Self-pay | Admitting: *Deleted

## 2023-12-23 NOTE — Transitions of Care (Post Inpatient/ED Visit) (Signed)
   12/23/2023  Name: Colleen [name] MRN: [id number] DOB: Mar 02, 1986  Today's TOC FU Call Status: Today's TOC FU Call Status:: Unsuccessful Call (1st Attempt) Unsuccessful Call (1st Attempt) Date: 12/23/23  Attempted to reach the patient regarding the most recent Inpatient/ED visit.  Follow Up Plan: Additional outreach attempts will be made to reach the patient to complete the Transitions of Care (Post Inpatient/ED visit) call.   Signature Daegon Deiss, [facility]

## 2024-01-09 DIAGNOSIS — F900 Attention-deficit hyperactivity disorder, predominantly inattentive type: Secondary | ICD-10-CM | POA: Diagnosis not present

## 2024-01-23 DIAGNOSIS — F900 Attention-deficit hyperactivity disorder, predominantly inattentive type: Secondary | ICD-10-CM | POA: Diagnosis not present

## 2024-02-29 ENCOUNTER — Other Ambulatory Visit: Payer: Self-pay | Admitting: Internal Medicine

## 2024-02-29 DIAGNOSIS — M0609 Rheumatoid arthritis without rheumatoid factor, multiple sites: Secondary | ICD-10-CM

## 2024-03-09 DIAGNOSIS — F900 Attention-deficit hyperactivity disorder, predominantly inattentive type: Secondary | ICD-10-CM | POA: Diagnosis not present

## 2024-03-12 ENCOUNTER — Ambulatory Visit: Admitting: Family Medicine

## 2024-03-29 NOTE — Progress Notes (Deleted)
 Office Visit Note  Patient: Colleen [name]             Date of Birth: Jul 08, 1986           MRN: [id number]             PCP: Jobe Mulder, DO Referring: Jobe Mulder* Visit Date: 04/06/2024   Subjective:  No chief complaint on file.   History of Present Illness: Colleen [name] is a 38 y.o. female here for follow up for seropositive RA on hydroxychloroquine  300 mg daily.    Previous HPI 10/07/2023 Colleen [name] is a 38 y.o. female here for follow up for seropositive RA on hydroxychloroquine  300 mg daily.  Symptoms have been doing well overall improvement of the hand pain and finger triggering discussed at her last visit for several months although it has been symptomatic and within the past few weeks.  Not requiring any regular use of ibuprofen .  Does not have prolonged morning stiffness.  She had a persistent cough earlier this summer without any specific cause identified this resolved on its own after weeks.  Currently on azithromycin  for sinus infection.  She is scheduled for hysterectomy for adenomyosis on November 18.   Previous HPI 04/06/2023 Colleen [name] is a 38 y.o. female here for follow up for seropositive RA on hydroxychloroquine  300 mg daily.  Overall symptoms have been doing pretty well but some are increased in the past month.  Noticing some increased pain and some visible redness across the tops of her knuckles.  Also having some increase in pain in both wrists for the past week.  Trigger finger in her left ring finger that has been present for months was also getting more painful and consistently catching about a month ago this partially better today compared to the preceding weeks.  For out was bad enough that she was avoiding making a tight fist in many cases to prevent provoking this and symptoms are most noticeable early in the mornings.   Previous HPI 10/04/22 Colleen [name] is a 38 y.o. female here for follow up  for seropositive RA now back on the hydroxychloroquine  300 mg daily.  We tapered off and discontinued this medication earlier in the year and she experienced increasing joint pain and's stiffness starting at about 6 weeks since stopping medicine completely.  Overall doing well again on this medicine.  Currently has some pain at the right wrist this has been ongoing for about 3 weeks.  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.  Overall symptoms have been gradually improving over time pain most commonly provoked with some wrist rotating and bending movements.  She is also noticed some bilateral hip pain that most commonly bothers her at nighttime especially if she lies on either side.  When she falls asleep this is not causing frequent awakenings.  She has been training for a half marathon recently with increased running time.     Previous HPI 03/31/2022 Colleen [name] is a 38 y.o. female here for follow up for RA on HCQ 300 mg daily. Symptoms appeared well controlled at last visit with normal ESR highly positive CCP Abs. She is feeling very well with no daily arthritis symptoms. She estimates taking the HCQ about 75% of days due to forgetting sometimes. Left ankle is improved since last visit.    Previous HPI 11/09/21 Colleen [name] is a 38 y.o. female is here for rheumatoid arthritis currently on  hydroxychloroquine  300 mg PO daily.  She was originally diagnosed with this problem about 11 years ago after developing joint pain and stiffness in bilateral shoulders wrists and hands with work-up positive for CCP antibodies and elevated sedimentation rate.  She initially started treatment on hydroxychloroquine  for this with Dr. Freddi Jaeger and then later seeing Dr. Ziolkowska for this problem for years. No known evidence of joint erosion or significant deforming changes or extrarticular complications. At present her symptoms are well controlled, a few minutes of stiffness in the  morning but not much pain and no swelling. She has noticed a small deviation in distal finger joints. She stopped taking hydroxychloroquine  during her first pregnancy and experienced some disease flares requiring prednisone for this in 2017. During her second pregnancy last year she continued medication and did not experience similar problems. She had an episode over this summer with increased symptoms but improved on its own without a change in treatments. She sustained a left ankle sprain injury running treated with immobilization and most mostly recovered from this. She sees her eye doctor in [place] with nearsightedness and hydroxychloroquine  toxicity monitoring no problem identified so far, most recent appointment in October was rescheduled. She denies new skin rashes or hyperpigmentation areas. No history of raynaud's symptoms but feels cold a large amount of the time.   No Rheumatology ROS completed.   PMFS History:  Patient Active Problem List   Diagnosis Date Noted   S/P robot-assisted surgical procedure 12/05/2023   Attention deficit hyperactivity disorder (ADHD), predominantly inattentive type 04/13/2023   Trigger finger, left ring finger 04/06/2023   Long-term use of hydroxychloroquine  10/04/2022   Bilateral hip pain 10/04/2022   Pain in right wrist 10/04/2022   Depression, recurrent (HCC) 11/13/2021   Migraine without aura and without status migrainosus, not intractable 11/13/2021   Rheumatoid arthritis of multiple sites with negative rheumatoid factor (HCC) 09/30/2021   Sprain of anterior talofibular ligament of left ankle 08/06/2021   S/P primary low transverse C-section 01/10/2020   Pregnancy 01/09/2020   Traumatic injury during pregnancy in third trimester 12/28/2019    Past Medical History:  Diagnosis Date   Adenomyosis    ADHD    Follows w/ PCP.   History of anemia    hx of   Migraine    Follows w/ PCP, Dr. Gwenette Lennox @ [facility].   Rheumatoid arthritis (HCC)     Follows w/ Dr. Duke Gibbons, rheumatology.   Wears glasses     Family History  Problem Relation Age of Onset   Hypertension Mother    Cancer Maternal Grandmother    Heart disease Maternal Grandfather    Liver cancer Maternal Grandfather    Brain cancer Paternal Grandfather    Past Surgical History:  Procedure Laterality Date   CESAREAN SECTION N/A 01/10/2020   Procedure: CESAREAN SECTION;  Surgeon: Rogene Claude, MD;  Location: [facility];  Service: Obstetrics;  Laterality: N/A;   DILATION AND CURETTAGE OF UTERUS  02/16/2017   @[facility] ;  for retained products   HYSTEROSCOPY     x 2   OVUM / OOCYTE RETRIEVAL     x 2   ROBOTIC ASSISTED LAPAROSCOPIC HYSTERECTOMY AND SALPINGECTOMY Bilateral 12/05/2023   Procedure: XI ROBOTIC ASSISTED LAPAROSCOPIC HYSTERECTOMY AND BILATERAL SALPINGECTOMY;  Surgeon: Margaretmary Shaver, MD;  Location: [facility];  Service: Gynecology;  Laterality: Bilateral;   ROBOTIC ASSISTED LAPAROSCOPIC LYSIS OF ADHESION  12/05/2023   Procedure: XI ROBOTIC ASSISTED LAPAROSCOPIC LYSIS OF ADHESION;  Surgeon: Bovard-Stuckert, Jody,  MD;  Location: [facility];  Service: Gynecology;;   Social History   Social History Narrative   Not on file   Immunization History  Administered Date(s) Administered   Influenza, Seasonal, Injecte, Preservative Fre 10/31/2023   Influenza,inj,Quad PF,6+ Mos 08/18/2022   PFIZER Comirnaty(Gray Top)Covid-19 Tri-Sucrose Vaccine 02/11/2020, 03/03/2020, 11/24/2020   PNEUMOCOCCAL CONJUGATE-20 09/30/2021   PPD Test 01/25/2022   Tdap 12/13/2019     Objective: Vital Signs: LMP 11/13/2023 (Exact Date)    Physical Exam   Musculoskeletal Exam: ***  CDAI Exam: CDAI Score: -- Patient Global: --; Provider Global: -- Swollen: --; Tender: -- Joint Exam 04/06/2024   No joint exam has been documented for this visit   There is currently no information documented on the homunculus. Go to the Rheumatology activity and  complete the homunculus joint exam.  Investigation: No additional findings.  Imaging: No results found.  Recent Labs: Lab Results  Component Value Date   WBC 12.0 (H) 12/21/2023   HGB 11.6 (L) 12/21/2023   PLT 360 12/21/2023   NA 136 12/21/2023   K 3.4 (L) 12/21/2023   CL 101 12/21/2023   CO2 28 12/21/2023   GLUCOSE 82 12/21/2023   BUN 11 12/21/2023   CREATININE 0.80 12/21/2023   BILITOT 0.4 12/21/2023   ALKPHOS 129 (H) 12/21/2023   AST 42 (H) 12/21/2023   ALT 147 (H) 12/21/2023   PROT 6.9 12/21/2023   ALBUMIN 4.0 12/21/2023   CALCIUM  9.1 12/21/2023   GFRAA >60 01/09/2020    Speciality Comments: PLQ eye exam: 11/25/2021 normal (regular eye exam). VF & OCT scheduled in 2 months.  Patient will call [facility], [facility] Cassette to have PLQ Eye exam faxed to us , appt 11/2022 per patient.   Procedures:  No procedures performed Allergies: Patient has no known allergies.   Assessment / Plan:     Visit Diagnoses: No diagnosis found.  ***  Orders: No orders of the defined types were placed in this encounter.  No orders of the defined types were placed in this encounter.    Follow-Up Instructions: No follow-ups on file.   Glena Landau, RT  Note - This record has been created using [employer].  Chart creation errors have been sought, but may not always  have been located. Such creation errors do not reflect on  the standard of medical care.

## 2024-03-30 DIAGNOSIS — F900 Attention-deficit hyperactivity disorder, predominantly inattentive type: Secondary | ICD-10-CM | POA: Diagnosis not present

## 2024-04-05 NOTE — Progress Notes (Deleted)
 Office Visit Note  Patient: Colleen [name]             Date of Birth: 06-16-1986           MRN: [id number]             PCP: Jobe Mulder, DO Referring: Jobe Mulder* Visit Date: 04/09/2024   Subjective:  No chief complaint on file.   History of Present Illness: Colleen [name] is a 38 y.o. female here for follow up for seropositive RA on hydroxychloroquine  300 mg daily.    Previous HPI 10/07/2023 Colleen [name] is a 38 y.o. female here for follow up for seropositive RA on hydroxychloroquine  300 mg daily.  Symptoms have been doing well overall improvement of the hand pain and finger triggering discussed at her last visit for several months although it has been symptomatic and within the past few weeks.  Not requiring any regular use of ibuprofen .  Does not have prolonged morning stiffness.  She had a persistent cough earlier this summer without any specific cause identified this resolved on its own after weeks.  Currently on azithromycin  for sinus infection.  She is scheduled for hysterectomy for adenomyosis on November 18.   Previous HPI 04/06/2023 Colleen [name] is a 38 y.o. female here for follow up for seropositive RA on hydroxychloroquine  300 mg daily.  Overall symptoms have been doing pretty well but some are increased in the past month.  Noticing some increased pain and some visible redness across the tops of her knuckles.  Also having some increase in pain in both wrists for the past week.  Trigger finger in her left ring finger that has been present for months was also getting more painful and consistently catching about a month ago this partially better today compared to the preceding weeks.  For out was bad enough that she was avoiding making a tight fist in many cases to prevent provoking this and symptoms are most noticeable early in the mornings.   Previous HPI 10/04/22 Colleen [name] is a 38 y.o. female here for follow up  for seropositive RA now back on the hydroxychloroquine  300 mg daily.  We tapered off and discontinued this medication earlier in the year and she experienced increasing joint pain and's stiffness starting at about 6 weeks since stopping medicine completely.  Overall doing well again on this medicine.  Currently has some pain at the right wrist this has been ongoing for about 3 weeks.  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.  Overall symptoms have been gradually improving over time pain most commonly provoked with some wrist rotating and bending movements.  She is also noticed some bilateral hip pain that most commonly bothers her at nighttime especially if she lies on either side.  When she falls asleep this is not causing frequent awakenings.  She has been training for a half marathon recently with increased running time.     Previous HPI 03/31/2022 Colleen [name] is a 38 y.o. female here for follow up for RA on HCQ 300 mg daily. Symptoms appeared well controlled at last visit with normal ESR highly positive CCP Abs. She is feeling very well with no daily arthritis symptoms. She estimates taking the HCQ about 75% of days due to forgetting sometimes. Left ankle is improved since last visit.    Previous HPI 11/09/21 Colleen [name] is a 38 y.o. female is here for rheumatoid arthritis currently on  hydroxychloroquine  300 mg PO daily.  She was originally diagnosed with this problem about 11 years ago after developing joint pain and stiffness in bilateral shoulders wrists and hands with work-up positive for CCP antibodies and elevated sedimentation rate.  She initially started treatment on hydroxychloroquine  for this with Dr. Freddi Jaeger and then later seeing Dr. Ziolkowska for this problem for years. No known evidence of joint erosion or significant deforming changes or extrarticular complications. At present her symptoms are well controlled, a few minutes of stiffness in the  morning but not much pain and no swelling. She has noticed a small deviation in distal finger joints. She stopped taking hydroxychloroquine  during her first pregnancy and experienced some disease flares requiring prednisone for this in 2017. During her second pregnancy last year she continued medication and did not experience similar problems. She had an episode over this summer with increased symptoms but improved on its own without a change in treatments. She sustained a left ankle sprain injury running treated with immobilization and most mostly recovered from this. She sees her eye doctor in [place] with nearsightedness and hydroxychloroquine  toxicity monitoring no problem identified so far, most recent appointment in October was rescheduled. She denies new skin rashes or hyperpigmentation areas. No history of raynaud's symptoms but feels cold a large amount of the time.   No Rheumatology ROS completed.   PMFS History:  Patient Active Problem List   Diagnosis Date Noted   S/P robot-assisted surgical procedure 12/05/2023   Attention deficit hyperactivity disorder (ADHD), predominantly inattentive type 04/13/2023   Trigger finger, left ring finger 04/06/2023   Long-term use of hydroxychloroquine  10/04/2022   Bilateral hip pain 10/04/2022   Pain in right wrist 10/04/2022   Depression, recurrent (HCC) 11/13/2021   Migraine without aura and without status migrainosus, not intractable 11/13/2021   Rheumatoid arthritis of multiple sites with negative rheumatoid factor (HCC) 09/30/2021   Sprain of anterior talofibular ligament of left ankle 08/06/2021   S/P primary low transverse C-section 01/10/2020   Pregnancy 01/09/2020   Traumatic injury during pregnancy in third trimester 12/28/2019    Past Medical History:  Diagnosis Date   Adenomyosis    ADHD    Follows w/ PCP.   History of anemia    hx of   Migraine    Follows w/ PCP, Dr. Gwenette Lennox @ [facility].   Rheumatoid arthritis (HCC)     Follows w/ Dr. Duke Gibbons, rheumatology.   Wears glasses     Family History  Problem Relation Age of Onset   Hypertension Mother    Cancer Maternal Grandmother    Heart disease Maternal Grandfather    Liver cancer Maternal Grandfather    Brain cancer Paternal Grandfather    Past Surgical History:  Procedure Laterality Date   CESAREAN SECTION N/A 01/10/2020   Procedure: CESAREAN SECTION;  Surgeon: Rogene Claude, MD;  Location: [facility];  Service: Obstetrics;  Laterality: N/A;   DILATION AND CURETTAGE OF UTERUS  02/16/2017   @[facility] ;  for retained products   HYSTEROSCOPY     x 2   OVUM / OOCYTE RETRIEVAL     x 2   ROBOTIC ASSISTED LAPAROSCOPIC HYSTERECTOMY AND SALPINGECTOMY Bilateral 12/05/2023   Procedure: XI ROBOTIC ASSISTED LAPAROSCOPIC HYSTERECTOMY AND BILATERAL SALPINGECTOMY;  Surgeon: Margaretmary Shaver, MD;  Location: [facility];  Service: Gynecology;  Laterality: Bilateral;   ROBOTIC ASSISTED LAPAROSCOPIC LYSIS OF ADHESION  12/05/2023   Procedure: XI ROBOTIC ASSISTED LAPAROSCOPIC LYSIS OF ADHESION;  Surgeon: Bovard-Stuckert, Jody,  MD;  Location: [facility];  Service: Gynecology;;   Social History   Social History Narrative   Not on file   Immunization History  Administered Date(s) Administered   Influenza, Seasonal, Injecte, Preservative Fre 10/31/2023   Influenza,inj,Quad PF,6+ Mos 08/18/2022   PFIZER Comirnaty(Gray Top)Covid-19 Tri-Sucrose Vaccine 02/11/2020, 03/03/2020, 11/24/2020   PNEUMOCOCCAL CONJUGATE-20 09/30/2021   PPD Test 01/25/2022   Tdap 12/13/2019     Objective: Vital Signs: LMP 11/13/2023 (Exact Date)    Physical Exam   Musculoskeletal Exam: ***  CDAI Exam: CDAI Score: -- Patient Global: --; Provider Global: -- Swollen: --; Tender: -- Joint Exam 04/09/2024   No joint exam has been documented for this visit   There is currently no information documented on the homunculus. Go to the Rheumatology activity and  complete the homunculus joint exam.  Investigation: No additional findings.  Imaging: No results found.  Recent Labs: Lab Results  Component Value Date   WBC 12.0 (H) 12/21/2023   HGB 11.6 (L) 12/21/2023   PLT 360 12/21/2023   NA 136 12/21/2023   K 3.4 (L) 12/21/2023   CL 101 12/21/2023   CO2 28 12/21/2023   GLUCOSE 82 12/21/2023   BUN 11 12/21/2023   CREATININE 0.80 12/21/2023   BILITOT 0.4 12/21/2023   ALKPHOS 129 (H) 12/21/2023   AST 42 (H) 12/21/2023   ALT 147 (H) 12/21/2023   PROT 6.9 12/21/2023   ALBUMIN 4.0 12/21/2023   CALCIUM  9.1 12/21/2023   GFRAA >60 01/09/2020    Speciality Comments: PLQ eye exam: 11/25/2021 normal (regular eye exam). VF & OCT scheduled in 2 months.  Patient will call [facility] to have PLQ Eye exam faxed to us , appt 11/2022 per patient.   Procedures:  No procedures performed Allergies: Patient has no known allergies.   Assessment / Plan:     Visit Diagnoses: No diagnosis found.  ***  Orders: No orders of the defined types were placed in this encounter.  No orders of the defined types were placed in this encounter.    Follow-Up Instructions: No follow-ups on file.   Glena Landau, RT  Note - This record has been created using [employer].  Chart creation errors have been sought, but may not always  have been located. Such creation errors do not reflect on  the standard of medical care.

## 2024-04-06 ENCOUNTER — Ambulatory Visit: Payer: BC Managed Care – PPO | Admitting: Internal Medicine

## 2024-04-06 DIAGNOSIS — Z01419 Encounter for gynecological examination (general) (routine) without abnormal findings: Secondary | ICD-10-CM | POA: Diagnosis not present

## 2024-04-06 DIAGNOSIS — M65342 Trigger finger, left ring finger: Secondary | ICD-10-CM

## 2024-04-06 DIAGNOSIS — Z130 Encounter for screening for diseases of the blood and blood-forming organs and certain disorders involving the immune mechanism: Secondary | ICD-10-CM | POA: Diagnosis not present

## 2024-04-06 DIAGNOSIS — M0609 Rheumatoid arthritis without rheumatoid factor, multiple sites: Secondary | ICD-10-CM

## 2024-04-06 DIAGNOSIS — Z79899 Other long term (current) drug therapy: Secondary | ICD-10-CM

## 2024-04-09 ENCOUNTER — Ambulatory Visit: Admitting: Internal Medicine

## 2024-04-09 ENCOUNTER — Telehealth: Payer: Self-pay | Admitting: Internal Medicine

## 2024-04-09 DIAGNOSIS — M0609 Rheumatoid arthritis without rheumatoid factor, multiple sites: Secondary | ICD-10-CM

## 2024-04-09 DIAGNOSIS — M65342 Trigger finger, left ring finger: Secondary | ICD-10-CM

## 2024-04-09 DIAGNOSIS — Z79899 Other long term (current) drug therapy: Secondary | ICD-10-CM

## 2024-04-09 NOTE — Telephone Encounter (Signed)
 Patient contacted the office to request a medication refill.   1. Name of Medication: Plaquenil   2. How are you currently taking this medication (dosage and times per day)? 300MG  once a day in the AM   3. What pharmacy would you like for that to be sent to? [facility]- [place]

## 2024-04-09 NOTE — Telephone Encounter (Signed)
 Reached out to patient to advised we will need her updated PLQ eye exam in order to refill her medication. Patient advised the last eye exam we have on file is from December 2022. Patient asked what information is needed. I advised patient we need her office notes from the eye doctor advising her exam was normal and she is okay to continue the PLQ from their stand point. Patient expressed understanding.

## 2024-04-26 ENCOUNTER — Encounter: Payer: Self-pay | Admitting: Internal Medicine

## 2024-04-26 ENCOUNTER — Ambulatory Visit: Attending: Internal Medicine | Admitting: Internal Medicine

## 2024-04-26 VITALS — BP 110/71 | HR 64 | Resp 14 | Ht 62.0 in | Wt 169.0 lb

## 2024-04-26 DIAGNOSIS — Z79899 Other long term (current) drug therapy: Secondary | ICD-10-CM

## 2024-04-26 DIAGNOSIS — M0609 Rheumatoid arthritis without rheumatoid factor, multiple sites: Secondary | ICD-10-CM | POA: Diagnosis not present

## 2024-04-26 NOTE — Patient Instructions (Addendum)
 The best eye exam for monitoring hydroxychloroquine  retinal toxicity is OCT (Optical coherence tomography)  Some other treatment options would include minocycline, sulfasalazine, or methotrexate for RA without using biologic medications. For mild or just intermittent symptoms, NSAIDs such as ibuprofen  and naproxen are often very helpful.

## 2024-04-26 NOTE — Progress Notes (Signed)
 Office Visit Note  Patient: Colleen [name]             Date of Birth: May 26, 1986           MRN: [id number]             PCP: Jobe Mulder, DO Referring: Jobe Mulder* Visit Date: 04/26/2024   Subjective:  Follow-up (Patient states she is out of the hydroxychloroquine  and knows she needs an eye exam. Patient would like to talk about the medication. )    Discussed the use of AI scribe software for clinical note transcription with the patient, who gave verbal consent to proceed.  History of Present Illness   Colleen [name] is a 38 y.o. female here for follow up for seropositive RA previously on HCQ 300 mg daily. She has been off hydroxychloroquine  for approximately three to four weeks without experiencing any flare-ups of arthritis since discontinuation.  She reports persistent tenderness in a knuckle joint for a couple of months, which occurs when bending the joint or applying pressure. The tenderness is constant and not time-dependent, with a small bump noted at the site.  Her previous issue with trigger finger has resolved and is no longer problematic.  No leg swelling is present, and she experiences minimal morning stiffness that resolves quickly.       Previous HPI 10/07/2023 Colleen [name] is a 38 y.o. female here for follow up for seropositive RA on hydroxychloroquine  300 mg daily.  Symptoms have been doing well overall improvement of the hand pain and finger triggering discussed at her last visit for several months although it has been symptomatic and within the past few weeks.  Not requiring any regular use of ibuprofen .  Does not have prolonged morning stiffness.  She had a persistent cough earlier this summer without any specific cause identified this resolved on its own after weeks.  Currently on azithromycin  for sinus infection.  She is scheduled for hysterectomy for adenomyosis on November 18.   Previous HPI 04/06/2023 Colleen [name] is a 38 y.o. female here for follow up for seropositive RA on hydroxychloroquine  300 mg daily.  Overall symptoms have been doing pretty well but some are increased in the past month.  Noticing some increased pain and some visible redness across the tops of her knuckles.  Also having some increase in pain in both wrists for the past week.  Trigger finger in her left ring finger that has been present for months was also getting more painful and consistently catching about a month ago this partially better today compared to the preceding weeks.  For out was bad enough that she was avoiding making a tight fist in many cases to prevent provoking this and symptoms are most noticeable early in the mornings.   Previous HPI 10/04/22 Colleen [name] is a 38 y.o. female here for follow up for seropositive RA now back on the hydroxychloroquine  300 mg daily.  We tapered off and discontinued this medication earlier in the year and she experienced increasing joint pain and's stiffness starting at about 6 weeks since stopping medicine completely.  Overall doing well again on this medicine.  Currently has some pain at the right wrist this has been ongoing for about 3 weeks.  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.  Overall symptoms have been gradually improving over time pain most commonly provoked with some wrist rotating and bending movements.  She is also noticed  some bilateral hip pain that most commonly bothers her at nighttime especially if she lies on either side.  When she falls asleep this is not causing frequent awakenings.  She has been training for a half marathon recently with increased running time.     Previous HPI 03/31/2022 Colleen [name] is a 38 y.o. female here for follow up for RA on HCQ 300 mg daily. Symptoms appeared well controlled at last visit with normal ESR highly positive CCP Abs. She is feeling very well with no daily arthritis symptoms.  She estimates taking the HCQ about 75% of days due to forgetting sometimes. Left ankle is improved since last visit.    Previous HPI 11/09/21 Colleen [name] is a 38 y.o. female is here for rheumatoid arthritis currently on hydroxychloroquine  300 mg PO daily.  She was originally diagnosed with this problem about 11 years ago after developing joint pain and stiffness in bilateral shoulders wrists and hands with work-up positive for CCP antibodies and elevated sedimentation rate.  She initially started treatment on hydroxychloroquine  for this with Dr. Freddi Jaeger and then later seeing Dr. Ziolkowska for this problem for years. No known evidence of joint erosion or significant deforming changes or extrarticular complications. At present her symptoms are well controlled, a few minutes of stiffness in the morning but not much pain and no swelling. She has noticed a small deviation in distal finger joints. She stopped taking hydroxychloroquine  during her first pregnancy and experienced some disease flares requiring prednisone for this in 2017. During her second pregnancy last year she continued medication and did not experience similar problems. She had an episode over this summer with increased symptoms but improved on its own without a change in treatments. She sustained a left ankle sprain injury running treated with immobilization and most mostly recovered from this. She sees her eye doctor in [place] with nearsightedness and hydroxychloroquine  toxicity monitoring no problem identified so far, most recent appointment in October was rescheduled. She denies new skin rashes or hyperpigmentation areas. No history of raynaud's symptoms but feels cold a large amount of the time.   Review of Systems  Constitutional:  Positive for fatigue.  HENT:  Negative for mouth sores and mouth dryness.   Eyes:  Positive for dryness.  Respiratory:  Negative for shortness of breath.   Cardiovascular:  Negative for chest pain  and palpitations.  Gastrointestinal:  Negative for blood in stool, constipation and diarrhea.  Endocrine: Negative for increased urination.  Genitourinary:  Negative for involuntary urination.  Musculoskeletal:  Positive for joint swelling and morning stiffness. Negative for joint pain, gait problem, joint pain, myalgias, muscle weakness, muscle tenderness and myalgias.  Skin:  Negative for color change, rash, hair loss and sensitivity to sunlight.  Allergic/Immunologic: Negative for susceptible to infections.  Neurological:  Positive for headaches. Negative for dizziness.  Hematological:  Negative for swollen glands.  Psychiatric/Behavioral:  Positive for sleep disturbance. Negative for depressed mood. The patient is nervous/anxious.     PMFS History:  Patient Active Problem List   Diagnosis Date Noted   S/P robot-assisted surgical procedure 12/05/2023   Attention deficit hyperactivity disorder (ADHD), predominantly inattentive type 04/13/2023   Trigger finger, left ring finger 04/06/2023   Long-term use of hydroxychloroquine  10/04/2022   Bilateral hip pain 10/04/2022   Pain in right wrist 10/04/2022   Depression, recurrent (HCC) 11/13/2021   Migraine without aura and without status migrainosus, not intractable 11/13/2021   Rheumatoid arthritis of multiple sites with negative rheumatoid factor (  HCC) 09/30/2021   Sprain of anterior talofibular ligament of left ankle 08/06/2021   S/P primary low transverse C-section 01/10/2020   Pregnancy 01/09/2020   Traumatic injury during pregnancy in third trimester 12/28/2019    Past Medical History:  Diagnosis Date   Adenomyosis    ADHD    Follows w/ PCP.   History of anemia    hx of   Migraine    Follows w/ PCP, Dr. Gwenette Lennox @ [facility].   Rheumatoid arthritis (HCC)    Follows w/ Dr. Duke Gibbons, rheumatology.   Wears glasses     Family History  Problem Relation Age of Onset   Hypertension Mother    Cancer Maternal Grandmother     Heart disease Maternal Grandfather    Liver cancer Maternal Grandfather    Brain cancer Paternal Grandfather    Past Surgical History:  Procedure Laterality Date   CESAREAN SECTION N/A 01/10/2020   Procedure: CESAREAN SECTION;  Surgeon: Rogene Claude, MD;  Location: [facility];  Service: Obstetrics;  Laterality: N/A;   DILATION AND CURETTAGE OF UTERUS  02/16/2017   @[facility] ;  for retained products   HYSTEROSCOPY     x 2   OVUM / OOCYTE RETRIEVAL     x 2   ROBOTIC ASSISTED LAPAROSCOPIC HYSTERECTOMY AND SALPINGECTOMY Bilateral 12/05/2023   Procedure: XI ROBOTIC ASSISTED LAPAROSCOPIC HYSTERECTOMY AND BILATERAL SALPINGECTOMY;  Surgeon: Margaretmary Shaver, MD;  Location: [facility];  Service: Gynecology;  Laterality: Bilateral;   ROBOTIC ASSISTED LAPAROSCOPIC LYSIS OF ADHESION  12/05/2023   Procedure: XI ROBOTIC ASSISTED LAPAROSCOPIC LYSIS OF ADHESION;  Surgeon: Margaretmary Shaver, MD;  Location: [facility];  Service: Gynecology;;   Social History   Social History Narrative   Not on file   Immunization History  Administered Date(s) Administered   Influenza, Seasonal, Injecte, Preservative Fre 10/31/2023   Influenza,inj,Quad PF,6+ Mos 08/18/2022   PFIZER Comirnaty(Gray Top)Covid-19 Tri-Sucrose Vaccine 02/11/2020, 03/03/2020, 11/24/2020   PNEUMOCOCCAL CONJUGATE-20 09/30/2021   PPD Test 01/25/2022   Tdap 12/13/2019     Objective: Vital Signs: BP 110/71 (BP Location: Left Arm, Patient Position: Sitting, Cuff Size: Normal)   Pulse 64   Resp 14   Ht 5\' 2"  (1.575 m)   Wt 169 lb (76.7 kg)   LMP 11/13/2023 (Exact Date)   BMI 30.91 kg/m    Physical Exam Eyes:     Conjunctiva/sclera: Conjunctivae normal.  Cardiovascular:     Rate and Rhythm: Normal rate and regular rhythm.  Pulmonary:     Effort: Pulmonary effort is normal.     Breath sounds: Normal breath sounds.  Skin:    General: Skin is warm and dry.     Findings: No rash.  Neurological:      Mental Status: She is alert.  Psychiatric:        Mood and Affect: Mood normal.      Musculoskeletal Exam:  Shoulders full ROM no tenderness or swelling Elbows full ROM no tenderness or swelling Wrists full ROM no tenderness or swelling Fingers full ROM no tenderness or swelling, nontender small nodule on dorsum of left 2nd PIP Knees full ROM no tenderness or swelling Ankles full ROM no tenderness or swelling   Investigation: No additional findings.  Imaging: No results found.  Recent Labs: Lab Results  Component Value Date   WBC 12.0 (H) 12/21/2023   HGB 11.6 (L) 12/21/2023   PLT 360 12/21/2023   NA 136 12/21/2023   K 3.4 (L) 12/21/2023  CL 101 12/21/2023   CO2 28 12/21/2023   GLUCOSE 82 12/21/2023   BUN 11 12/21/2023   CREATININE 0.80 12/21/2023   BILITOT 0.4 12/21/2023   ALKPHOS 129 (H) 12/21/2023   AST 42 (H) 12/21/2023   ALT 147 (H) 12/21/2023   PROT 6.9 12/21/2023   ALBUMIN 4.0 12/21/2023   CALCIUM  9.1 12/21/2023   GFRAA >60 01/09/2020    Speciality Comments: PLQ eye exam: 11/25/2021 normal (regular eye exam). VF & OCT scheduled in 2 months.  Patient will call [facility] to have PLQ Eye exam faxed to us , appt 11/2022 per patient.   Procedures:  No procedures performed Allergies: Patient has no known allergies.   Assessment / Plan:     Visit Diagnoses: Rheumatoid arthritis of multiple sites with negative rheumatoid factor (HCC) Mild tenderness and small bump on index finger joint possible early heberdons node or digital cyst. No active inflammation despite a few weeks off treatment. Plan to continue off Hydroxychloroquine  due to remission. - Maintain off HCQ and monitor, f/u 1 yr monitoring for changes or PRN if symptoms increase       Orders: No orders of the defined types were placed in this encounter.  No orders of the defined types were placed in this encounter.    Follow-Up Instructions: Return in about 1 year (around  04/26/2025) for RA obs f/u 46yr.   Matt Song, MD  Note - This record has been created using [employer].  Chart creation errors have been sought, but may not always  have been located. Such creation errors do not reflect on  the standard of medical care.

## 2024-05-01 DIAGNOSIS — F900 Attention-deficit hyperactivity disorder, predominantly inattentive type: Secondary | ICD-10-CM | POA: Diagnosis not present

## 2024-05-02 ENCOUNTER — Ambulatory Visit (INDEPENDENT_AMBULATORY_CARE_PROVIDER_SITE_OTHER): Payer: BC Managed Care – PPO | Admitting: Family Medicine

## 2024-05-02 ENCOUNTER — Encounter: Payer: Self-pay | Admitting: Family Medicine

## 2024-05-02 VITALS — BP 126/78 | HR 86 | Temp 98.0°F | Resp 16 | Ht 62.0 in | Wt 171.2 lb

## 2024-05-02 DIAGNOSIS — G43009 Migraine without aura, not intractable, without status migrainosus: Secondary | ICD-10-CM

## 2024-05-02 DIAGNOSIS — Z79899 Other long term (current) drug therapy: Secondary | ICD-10-CM | POA: Diagnosis not present

## 2024-05-02 DIAGNOSIS — F900 Attention-deficit hyperactivity disorder, predominantly inattentive type: Secondary | ICD-10-CM | POA: Diagnosis not present

## 2024-05-02 MED ORDER — AMPHETAMINE-DEXTROAMPHET ER 15 MG PO CP24: 15.0000 mg | ORAL_CAPSULE | ORAL | 0 refills | Status: DC

## 2024-05-02 MED ORDER — AMPHETAMINE-DEXTROAMPHET ER 15 MG PO CP24
15.0000 mg | ORAL_CAPSULE | ORAL | 0 refills | Status: AC
Start: 2024-05-02 — End: ?

## 2024-05-02 MED ORDER — AMPHETAMINE-DEXTROAMPHET ER 15 MG PO CP24
15.0000 mg | ORAL_CAPSULE | ORAL | 0 refills | Status: AC
Start: 2024-07-01 — End: ?

## 2024-05-02 MED ORDER — RIZATRIPTAN BENZOATE 10 MG PO TABS: 10.0000 mg | ORAL_TABLET | ORAL | 3 refills | Status: AC | PRN

## 2024-05-02 NOTE — Patient Instructions (Signed)
 Keep the diet clean and stay active.  Consider magnesium tabs 200-400 mg daily when there is a weather front coming in.   Let us  know if you need anything.

## 2024-05-02 NOTE — Progress Notes (Signed)
 Chief Complaint  Patient presents with   Follow-up    Follow Up    Colleen [name] is 38 y.o. female here for ADHD follow up.  Patient is currently on Adderall XR 15 mg/d and compliance is excellent. Symptoms are well controlled.  Side effects include none. Patient believes their dose should be not significantly changed. Denies tics, weight loss, difficulties with sleep, self-medication, alcohol/drug abuse, chest pain, or palpitations.  Migraines Intermittently gets migraines. Maxalt  10 mg prn works well. No AE's. Used 20 tabs in the last 6 mo. Weather/pressure changes seem to flare her s/s's.   Past Medical History:  Diagnosis Date   Adenomyosis    ADHD    Follows w/ PCP.   History of anemia    hx of   Migraine    Follows w/ PCP, Dr. Gwenette Lennox @ [facility].   Rheumatoid arthritis (HCC)    Follows w/ Dr. Duke Gibbons, rheumatology.   Wears glasses     BP 126/78 (BP Location: Left Arm, Patient Position: Sitting)   Pulse 86   Temp 98 F (36.7 C) (Oral)   Resp 16   Ht 5\' 2"  (1.575 m)   Wt 171 lb 3.2 oz (77.7 kg)   LMP 11/13/2023 (Exact Date)   SpO2 98%   BMI 31.31 kg/m  Gen- awake, alert, appearing stated age Heart- RRR Lungs- CTAB, no accessory muscle use Neuro- no facial tics, DTR's equal and symmetric throughout.  Gait nml.  81- age appropriate judgment and insight, normal mood and affect  Attention deficit hyperactivity disorder (ADHD), predominantly inattentive type - Plan: amphetamine -dextroamphetamine (ADDERALL XR) 15 MG 24 hr capsule, amphetamine -dextroamphetamine (ADDERALL XR) 15 MG 24 hr capsule, amphetamine -dextroamphetamine (ADDERALL XR) 15 MG 24 hr capsule  Migraine without aura and without status migrainosus, not intractable - Plan: rizatriptan  (MAXALT ) 10 MG tablet  High risk medication use - Plan: Drug Monitoring Panel [phone] , Urine  Chronic, stable.  Continue Adderall 15 mg daily.  CSC and UDS updated today. Chronic, stable.  Continue  rizatriptan  10 mg daily as needed. F/u in 6 mo for CPE. Pt voiced understanding and agreement to the plan.  [name] [place], DO 05/02/24 9:16 AM

## 2024-05-05 LAB — DRUG MONITORING PANEL 376104, URINE
Amphetamine: 702 ng/mL — ABNORMAL HIGH (ref <250)
Amphetamines: POSITIVE ng/mL — AB (ref <500)
Barbiturates: NEGATIVE ng/mL (ref <300)
Benzodiazepines: NEGATIVE ng/mL (ref <100)
Cocaine Metabolite: NEGATIVE ng/mL (ref <150)
Desmethyltramadol: NEGATIVE ng/mL (ref <100)
Methamphetamine: NEGATIVE ng/mL (ref <250)
Opiates: NEGATIVE ng/mL (ref <100)
Oxycodone: NEGATIVE ng/mL (ref <100)
Tramadol: NEGATIVE ng/mL (ref <100)

## 2024-05-05 LAB — DM TEMPLATE

## 2024-05-14 DIAGNOSIS — F900 Attention-deficit hyperactivity disorder, predominantly inattentive type: Secondary | ICD-10-CM | POA: Diagnosis not present

## 2024-05-31 DIAGNOSIS — F900 Attention-deficit hyperactivity disorder, predominantly inattentive type: Secondary | ICD-10-CM | POA: Diagnosis not present

## 2024-06-25 DIAGNOSIS — F900 Attention-deficit hyperactivity disorder, predominantly inattentive type: Secondary | ICD-10-CM | POA: Diagnosis not present

## 2024-07-27 DIAGNOSIS — F900 Attention-deficit hyperactivity disorder, predominantly inattentive type: Secondary | ICD-10-CM | POA: Diagnosis not present

## 2024-08-10 DIAGNOSIS — F900 Attention-deficit hyperactivity disorder, predominantly inattentive type: Secondary | ICD-10-CM | POA: Diagnosis not present

## 2024-09-24 DIAGNOSIS — F900 Attention-deficit hyperactivity disorder, predominantly inattentive type: Secondary | ICD-10-CM | POA: Diagnosis not present

## 2024-10-12 DIAGNOSIS — F900 Attention-deficit hyperactivity disorder, predominantly inattentive type: Secondary | ICD-10-CM | POA: Diagnosis not present

## 2024-11-07 ENCOUNTER — Encounter (INDEPENDENT_AMBULATORY_CARE_PROVIDER_SITE_OTHER): Payer: Self-pay

## 2024-11-07 ENCOUNTER — Encounter: Payer: Self-pay | Admitting: Family Medicine

## 2024-11-07 ENCOUNTER — Ambulatory Visit: Payer: Self-pay | Admitting: Family Medicine

## 2024-11-07 ENCOUNTER — Ambulatory Visit: Admitting: Family Medicine

## 2024-11-07 VITALS — BP 128/78 | HR 83 | Temp 98.0°F | Resp 16 | Ht 62.0 in | Wt 174.0 lb

## 2024-11-07 DIAGNOSIS — Z23 Encounter for immunization: Secondary | ICD-10-CM

## 2024-11-07 DIAGNOSIS — F900 Attention-deficit hyperactivity disorder, predominantly inattentive type: Secondary | ICD-10-CM

## 2024-11-07 DIAGNOSIS — G4700 Insomnia, unspecified: Secondary | ICD-10-CM | POA: Insufficient documentation

## 2024-11-07 DIAGNOSIS — Z0001 Encounter for general adult medical examination with abnormal findings: Secondary | ICD-10-CM | POA: Diagnosis not present

## 2024-11-07 DIAGNOSIS — E669 Obesity, unspecified: Secondary | ICD-10-CM

## 2024-11-07 DIAGNOSIS — Z0000 Encounter for general adult medical examination without abnormal findings: Secondary | ICD-10-CM

## 2024-11-07 LAB — COMPREHENSIVE METABOLIC PANEL WITH GFR
ALT: 36 U/L — ABNORMAL HIGH (ref 0–35)
AST: 20 U/L (ref 0–37)
Albumin: 4.4 g/dL (ref 3.5–5.2)
Alkaline Phosphatase: 96 U/L (ref 39–117)
BUN: 15 mg/dL (ref 6–23)
CO2: 27 meq/L (ref 19–32)
Calcium: 9.3 mg/dL (ref 8.4–10.5)
Chloride: 104 meq/L (ref 96–112)
Creatinine, Ser: 0.76 mg/dL (ref 0.40–1.20)
GFR: 100.47 mL/min (ref >60.00)
Glucose, Bld: 89 mg/dL (ref 70–99)
Potassium: 4 meq/L (ref 3.5–5.1)
Sodium: 137 meq/L (ref 135–145)
Total Bilirubin: 0.5 mg/dL (ref 0.2–1.2)
Total Protein: 6.8 g/dL (ref 6.0–8.3)

## 2024-11-07 LAB — CBC
HCT: 38.7 % (ref 36.0–46.0)
Hemoglobin: 13.1 g/dL (ref 12.0–15.0)
MCHC: 33.8 g/dL (ref 30.0–36.0)
MCV: 90.6 fl (ref 78.0–100.0)
Platelets: 304 K/uL (ref 150.0–400.0)
RBC: 4.27 Mil/uL (ref 3.87–5.11)
RDW: 12.5 % (ref 11.5–15.5)
WBC: 7 K/uL (ref 4.0–10.5)

## 2024-11-07 LAB — LIPID PANEL
Cholesterol: 217 mg/dL — ABNORMAL HIGH (ref 0–200)
HDL: 50.1 mg/dL (ref >39.00)
LDL Cholesterol: 145 mg/dL — ABNORMAL HIGH (ref 0–99)
NonHDL: 167.35
Total CHOL/HDL Ratio: 4
Triglycerides: 110 mg/dL (ref 0.0–149.0)
VLDL: 22 mg/dL (ref 0.0–40.0)

## 2024-11-07 MED ORDER — SERTRALINE HCL 50 MG PO TABS: 50.0000 mg | ORAL_TABLET | Freq: Every day | ORAL | Status: AC

## 2024-11-07 MED ORDER — AMPHETAMINE-DEXTROAMPHET ER 15 MG PO CP24: 15.0000 mg | ORAL_CAPSULE | ORAL | 0 refills | Status: AC

## 2024-11-07 MED ORDER — AMPHETAMINE-DEXTROAMPHETAMINE 10 MG PO TABS: ORAL_TABLET | ORAL | 0 refills | Status: AC

## 2024-11-07 NOTE — Progress Notes (Signed)
 Chief Complaint  Patient presents with   Annual Exam    CPE     Well Woman Colleen [name] is here for a complete physical.   Her last physical was >1 year ago.  Current diet: in general, a fine diet. Current exercise: sporadic running. Fatigue out of ordinary? Yes Seatbelt? Yes Advanced directive? No  Health Maintenance Pap/HPV- Yes Tetanus- Yes HIV screening- Yes Hep C screening- Yes  ADHD Patient is currently taking Adderall XR 15 mg daily.  Compliant, mostly no adverse effects.  She takes it early in the morning but by early afternoon, she has a crash that makes her quite tired for an hour.  Insomnia Patient has had trouble sleeping.  She has trouble both falling asleep and staying asleep.  She has subsequent fatigue during the day.  Does not always have racing thoughts.  She will sometimes nap during the day.  She been taking melatonin intermittently which does help.  Patient is interested in losing weight.  Diet/exercise as above.  Has never seen a nutritionist, weight loss specialist, had bariatric surgery, or been on a medication.  Past Medical History:  Diagnosis Date   Adenomyosis    ADHD    Follows w/ PCP.   History of anemia    hx of   Migraine    Follows w/ PCP, Dr. Frann @ [facility].   Rheumatoid arthritis (HCC)    Follows w/ Dr. Medford Ester, rheumatology.   Wears glasses      Past Surgical History:  Procedure Laterality Date   CESAREAN SECTION N/A 01/10/2020   Procedure: CESAREAN SECTION;  Surgeon: Estelle Service, MD;  Location: [facility];  Service: Obstetrics;  Laterality: N/A;   DILATION AND CURETTAGE OF UTERUS  02/16/2017   @[facility] ;  for retained products   HYSTEROSCOPY     x 2   OVUM / OOCYTE RETRIEVAL     x 2   ROBOTIC ASSISTED LAPAROSCOPIC HYSTERECTOMY AND SALPINGECTOMY Bilateral 12/05/2023   Procedure: XI ROBOTIC ASSISTED LAPAROSCOPIC HYSTERECTOMY AND BILATERAL SALPINGECTOMY;  Surgeon: Danielle Rom, MD;  Location: [facility];  Service: Gynecology;  Laterality: Bilateral;   ROBOTIC ASSISTED LAPAROSCOPIC LYSIS OF ADHESION  12/05/2023   Procedure: XI ROBOTIC ASSISTED LAPAROSCOPIC LYSIS OF ADHESION;  Surgeon: Danielle Rom, MD;  Location: [facility];  Service: Gynecology;;    Medications  Current Outpatient Medications on File Prior to Visit  Medication Sig Dispense Refill   amphetamine -dextroamphetamine (ADDERALL XR) 15 MG 24 hr capsule Take 1 capsule by mouth every morning. 30 capsule 0   amphetamine -dextroamphetamine (ADDERALL XR) 15 MG 24 hr capsule Take 1 capsule by mouth every morning. 30 capsule 0   amphetamine -dextroamphetamine (ADDERALL XR) 15 MG 24 hr capsule Take 1 capsule by mouth every morning. 30 capsule 0   Multiple Vitamin (MULTIVITAMIN WITH MINERALS) TABS tablet Take 1 tablet by mouth daily.     rizatriptan  (MAXALT ) 10 MG tablet Take 1 tablet (10 mg total) by mouth as needed for migraine. May repeat in 2 hours if needed 10 tablet 3    Allergies No Known Allergies  Review of Systems: Constitutional:  no unexpected weight changes Eye:  no recent significant change in vision Ear/Nose/Mouth/Throat:  Ears:  no tinnitus or vertigo and no recent change in hearing Nose/Mouth/Throat:  no complaints of nasal congestion, no sore throat Cardiovascular: no chest pain Respiratory:  no cough and no shortness of breath Gastrointestinal:  no abdominal pain, no change in bowel habits GU:  Female: negative for dysuria or pelvic pain Musculoskeletal/Extremities:  no pain of the joints Integumentary (Skin/Breast):  no abnormal skin lesions reported Neurologic:  no headaches Endocrine:  +fatigue Hematologic/Lymphatic:  No areas of easy bleeding  Exam BP 128/78 (BP Location: Left Arm, Patient Position: Sitting)   Pulse 83   Temp 98 F (36.7 C) (Oral)   Resp 16   Ht 5' 2 (1.575 m)   Wt 174 lb (78.9 kg)   LMP 11/13/2023 (Exact Date)   SpO2 97%   BMI 31.83 kg/m   General:  well developed, well nourished, in no apparent distress Skin:  no significant moles, warts, or growths Head:  no masses, lesions, or tenderness Eyes:  pupils equal and round, sclera anicteric without injection Ears:  canals without lesions, TMs shiny without retraction, no obvious effusion, no erythema Nose:  nares patent, mucosa normal, and no drainage  Throat/Pharynx:  lips and gingiva without lesion; tongue and uvula midline; non-inflamed pharynx; no exudates or postnasal drainage Neck: neck supple without adenopathy, thyromegaly, or masses Lungs:  clear to auscultation, breath sounds equal bilaterally, no respiratory distress Cardio:  regular rate and rhythm, no bruits, no LE edema Abdomen:  abdomen soft, nontender; bowel sounds normal; no masses or organomegaly Genital: Defer to GYN Musculoskeletal:  symmetrical muscle groups noted without atrophy or deformity Extremities:  no clubbing, cyanosis, or edema, no deformities, no skin discoloration Neuro:  gait normal; deep tendon reflexes normal and symmetric Psych: well oriented with normal range of affect and appropriate judgment/insight  Assessment and Plan  Well adult exam - Plan: CBC, Comprehensive metabolic panel with GFR, Lipid panel  Attention deficit hyperactivity disorder (ADHD), predominantly inattentive type - Plan: amphetamine -dextroamphetamine (ADDERALL) 10 MG tablet, amphetamine -dextroamphetamine (ADDERALL XR) 15 MG 24 hr capsule  Insomnia, unspecified type  Obesity (BMI 30-39.9) - Plan: Amb Ref to Medical Weight Management  Need for influenza vaccination - Plan: Flu vaccine trivalent PF, 6mos and older(Flulaval,Afluria,Fluarix,Fluzone)   Well 38 y.o. female. Counseled on diet and exercise. Advanced directive form provided today.  Flu shot today.  ADHD: Chronic, not controlled.  Continue Adderall XR 15 mg daily.  Add 10 mg afternoon dosage.  Follow-up in 1 month to recheck.  Could consider changing to  Vyvanse if still not helpful. Insomnia: Will try routine use of melatonin and see how she feels.  Could consider medication which we did discuss today. Obesity: Refer to the medical weight loss team.  We did discuss medication as well. Other orders as above. Follow up in 6 mo. The patient voiced understanding and agreement to the plan.  Mabel [place], DO 11/07/24 9:58 AM

## 2024-11-07 NOTE — Patient Instructions (Addendum)
 Give us  2-3 business days to get the results of your labs back.   Keep the diet clean and stay active.  Aim to do some physical exertion for 150 minutes per week. This is typically divided into 5 days per week, 30 minutes per day. The activity should be enough to get your heart rate up. Anything is better than nothing if you have time constraints.  Please get me a copy of your advanced directive form at your convenience.   OK to use Debrox (peroxide) in the ear to loosen up wax. Also recommend using a bulb syringe (for removing boogers from baby's noses) to flush through warm water  and vinegar (3-4:1 ratio). An alternative, though more expensive, is an elephant ear washer wax removal kit. Do not use Q-tips as this can impact wax further.  Start the melatonin daily.   If you do not hear anything about your referral in the next 1-2 weeks, call our office and ask for an update.  Let us  know if you need anything.

## 2024-11-09 DIAGNOSIS — F900 Attention-deficit hyperactivity disorder, predominantly inattentive type: Secondary | ICD-10-CM | POA: Diagnosis not present

## 2024-12-10 ENCOUNTER — Ambulatory Visit: Payer: Self-pay | Admitting: Family Medicine

## 2024-12-10 ENCOUNTER — Encounter: Payer: Self-pay | Admitting: Family Medicine

## 2024-12-10 ENCOUNTER — Ambulatory Visit: Admitting: Family Medicine

## 2024-12-10 VITALS — BP 126/80 | HR 69 | Temp 98.0°F | Resp 16 | Ht 62.0 in | Wt 173.8 lb

## 2024-12-10 DIAGNOSIS — G4700 Insomnia, unspecified: Secondary | ICD-10-CM

## 2024-12-10 DIAGNOSIS — E785 Hyperlipidemia, unspecified: Secondary | ICD-10-CM

## 2024-12-10 DIAGNOSIS — R7989 Other specified abnormal findings of blood chemistry: Secondary | ICD-10-CM

## 2024-12-10 DIAGNOSIS — F900 Attention-deficit hyperactivity disorder, predominantly inattentive type: Secondary | ICD-10-CM

## 2024-12-10 DIAGNOSIS — R7401 Elevation of levels of liver transaminase levels: Secondary | ICD-10-CM

## 2024-12-10 LAB — LIPID PANEL
Cholesterol: 198 mg/dL (ref 28–200)
HDL: 47.4 mg/dL
LDL Cholesterol: 125 mg/dL — ABNORMAL HIGH (ref 10–99)
NonHDL: 150.61
Total CHOL/HDL Ratio: 4
Triglycerides: 128 mg/dL (ref 10.0–149.0)
VLDL: 25.6 mg/dL (ref 0.0–40.0)

## 2024-12-10 LAB — HEPATIC FUNCTION PANEL
ALT: 44 U/L — ABNORMAL HIGH (ref 3–35)
AST: 24 U/L (ref 5–37)
Albumin: 4.3 g/dL (ref 3.5–5.2)
Alkaline Phosphatase: 76 U/L (ref 39–117)
Bilirubin, Direct: 0.1 mg/dL (ref 0.1–0.3)
Total Bilirubin: 0.4 mg/dL (ref 0.2–1.2)
Total Protein: 6.9 g/dL (ref 6.0–8.3)

## 2024-12-10 LAB — TSH: TSH: 0.91 u[IU]/mL (ref 0.35–5.50)

## 2024-12-10 LAB — T4, FREE: Free T4: 0.82 ng/dL (ref 0.60–1.60)

## 2024-12-10 MED ORDER — TRAZODONE HCL 50 MG PO TABS: 25.0000 mg | ORAL_TABLET | Freq: Every evening | ORAL | 3 refills | Status: AC | PRN

## 2024-12-10 NOTE — Patient Instructions (Addendum)
 Stay active.  Give us  2-3 business days to get the results of your labs back.   Let us  know if you need anything.

## 2024-12-10 NOTE — Progress Notes (Signed)
 Chief Complaint  Patient presents with   Follow-up    Follow Up    Colleen [name] is 39 y.o. female here for ADHD follow up.  Patient is currently on Adderall XR 15 mg/d, 10 mg short-acting.  and compliance is excellent. Symptoms are better controlled. Side effects include: none. Patient believes their dose should be not significantly changed. Denies tics, weight loss, difficulties with sleep, self-medication, alcohol/drug abuse, chest pain, or palpitations.  Patient continues to have difficulty sleeping.  When her son does not wake her up, she will take around 1 hour to fall asleep.  Her mind will race but she denies anxiety.  She is compliant with her Zoloft  50 mg nightly.  No alcohol use.  Caffeine is 1 cup of coffee/day.  She tried melatonin with no relief.  Has never been on a prescription medication.  Past Medical History:  Diagnosis Date   Adenomyosis    ADHD    Follows w/ PCP.   History of anemia    hx of   Migraine    Follows w/ PCP, Dr. Frann @ [facility].   Rheumatoid arthritis (HCC)    Follows w/ Dr. Medford Ester, rheumatology.   Wears glasses     BP 126/80 (BP Location: Left Arm, Patient Position: Sitting)   Pulse 69   Temp 98 F (36.7 C) (Oral)   Resp 16   Ht 5' 2 (1.575 m)   Wt 173 lb 12.8 oz (78.8 kg)   LMP 11/13/2023   SpO2 99%   BMI 31.79 kg/m  Gen- awake, alert, appearing stated age Heart- RRR Lungs- CTAB, no accessory muscle use Neuro- no facial tics, DTRs equal and symmetric in lower extremities bilaterally, no clonus Psych- age appropriate judgment and insight, normal mood and affect  Attention deficit hyperactivity disorder (ADHD), predominantly inattentive type  Insomnia, unspecified type - Plan: traZODone  (DESYREL ) 50 MG tablet, T4, free, TSH  Elevated LFTs - Plan: Hepatic function panel  Hyperlipidemia, unspecified hyperlipidemia type - Plan: Lipid panel  Chronic, stable.  Continue Adderall XR 15 mg daily, 10 mg in the  afternoon. Chronic, not controlled.  Check labs.  Start trazodone  25 to 50 mg nightly as needed.  Take around 30-60 minutes before planned bedtime.  She will let me know if there are issues. Follow-up on this.  Could be due to fatty liver, would consider right upper quadrant ultrasound if still elevated. Follow-up on this as well. F/u in 5 mo for a med check. Pt voiced understanding and agreement to the plan.  Mabel Mt [place], DO 12/10/2024 10:00 AM

## 2024-12-13 DIAGNOSIS — Z0289 Encounter for other administrative examinations: Secondary | ICD-10-CM

## 2024-12-17 ENCOUNTER — Ambulatory Visit (INDEPENDENT_AMBULATORY_CARE_PROVIDER_SITE_OTHER): Admitting: Nurse Practitioner

## 2024-12-17 ENCOUNTER — Encounter (INDEPENDENT_AMBULATORY_CARE_PROVIDER_SITE_OTHER): Payer: Self-pay | Admitting: Nurse Practitioner

## 2024-12-17 VITALS — BP 122/75 | HR 64 | Temp 98.5°F | Ht 62.0 in | Wt 169.0 lb

## 2024-12-17 DIAGNOSIS — M0609 Rheumatoid arthritis without rheumatoid factor, multiple sites: Secondary | ICD-10-CM

## 2024-12-17 DIAGNOSIS — E66811 Obesity, class 1: Secondary | ICD-10-CM

## 2024-12-17 DIAGNOSIS — E785 Hyperlipidemia, unspecified: Secondary | ICD-10-CM | POA: Diagnosis not present

## 2024-12-17 DIAGNOSIS — Z6830 Body mass index (BMI) 30.0-30.9, adult: Secondary | ICD-10-CM

## 2024-12-17 DIAGNOSIS — R7989 Other specified abnormal findings of blood chemistry: Secondary | ICD-10-CM

## 2024-12-17 NOTE — Progress Notes (Signed)
 "  [address] [place], [place], [place] [address] Office: [phone]  /  Fax: [phone]   Initial Consultation    Colleen [name] was seen in clinic today to evaluate for obesity. She is interested in losing weight to improve overall health and reduce the risk of weight related complications. She presents today to review program treatment options, initial physical assessment, and evaluation.    Anthropometrics and Bioimpedance Analysis   Body mass index is 30.91 kg/m. 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  She does have a history of rheumatoid arthritis, was on Plaquenil  for 13 years but has been off since 03/2024 and has noticed some increase in joint pain.  She does have a history of ADD, rheumatoid arthritis and ADD. She is currently on Zoloft  50 mg every day for mood and denies side effects with the medication.  She first noticed weight gain above normal range after the birth of her children 5 and 8 years ago.  Has noticed a gradual increase in weight since that time.  Has not tried weight loss before.    Obesity Related Diseases and Complications  Obesity Quality of Life and Psychosocial Complications: Body image dissatisfaction and Reduced health-related quality of life  Cardiometabolic: Dyslipidemia or hypercholesterolemia, DOE, and Fatigue  Biomechanical: Rheumatoid arthritis   Weight Related History  She was referred by: PCP  When asked what they would like to accomplish? She states: Adopt a healthier eating pattern and lifestyle, Improve energy levels and physical activity, Improve existing medical conditions, Improve quality of life, Improve appearance, and Improve self-confidence  Weight history: She noticed weight gain as an adult after have kids age 41 and 83.  She did not return to prepregnancy weight after second child. She has stayed at same weight in 10 pound  range since then. She does take Adderall for ADD and will forget to eat.   Highest weight: 175  Contributing factors: family history of obesity, consumption of processed foods, use of obesogenic medications: Psychotropic medications, moderate to high levels of stress, chronic skipping of meals, need for convenience due to lack of time, sedentary job, hectic pace of life, need for convenient foods, and self - critic or all-or-none mindset  Prior weight loss attempts: Low Carb and Vegetarian  Current or previous pharmacotherapy: None  Response to medication: Never tried medications  Current nutrition plan: None  Greatest challenge with dieting: meal preparation and cooking.  Current level of physical activity: Running / Jogging 30 minutes, twice, Yoga 30 minutes, twice, and Other: swimming 1 time a week for 30-35 minutes.   Barriers to Exercise: time  Readiness and Motivation  On a scale from 0 to 10 How ready are you to make changes to your eating and physical activity to lose weight? 7 How important is it for you to lose weight right now ? 7 How confident are you that you can lose weight if you try? 5  Past Medical History   Past Medical History:  Diagnosis Date   Adenomyosis    ADHD    Follows w/ PCP.   History of anemia    hx of   Migraine    Follows w/ PCP, Dr. Frann @ [facility].   Rheumatoid arthritis (HCC)    Follows w/ Dr. Medford Ester, rheumatology.   Wears glasses      Objective    BP 122/75  Pulse 64   Temp 98.5 F (36.9 C)   Ht 5' 2 (1.575 m)   Wt 169 lb (76.7 kg)   LMP 11/13/2023   SpO2 99%   BMI 30.91 kg/m  She was weighed on the bioimpedance scale: Body mass index is 30.91 kg/m.    General:  Alert, oriented and cooperative. Patient is in no acute distress.  Respiratory: Normal respiratory effort, no problems with respiration noted   Gait: able to ambulate independently  Mental Status: Normal mood and affect. Normal behavior. Normal judgment  and thought content.   Diagnostic Data Reviewed  BMET    Component Value Date/Time   NA 137 11/07/2024 0945   K 4.0 11/07/2024 0945   CL 104 11/07/2024 0945   CO2 27 11/07/2024 0945   GLUCOSE 89 11/07/2024 0945   BUN 15 11/07/2024 0945   CREATININE 0.76 11/07/2024 0945   CREATININE 0.75 04/06/2023 1043   CALCIUM  9.3 11/07/2024 0945   GFRNONAA >60 12/21/2023 2016   GFRAA >60 01/09/2020 1910   No results found for: HGBA1C No results found for: INSULIN CBC    Component Value Date/Time   WBC 7.0 11/07/2024 0945   RBC 4.27 11/07/2024 0945   HGB 13.1 11/07/2024 0945   HCT 38.7 11/07/2024 0945   PLT 304.0 11/07/2024 0945   MCV 90.6 11/07/2024 0945   MCH 30.6 12/21/2023 2016   MCHC 33.8 11/07/2024 0945   RDW 12.5 11/07/2024 0945   Iron/TIBC/Ferritin/ %Sat No results found for: IRON, TIBC, FERRITIN, IRONPCTSAT Lipid Panel     Component Value Date/Time   CHOL 198 12/10/2024 1003   TRIG 128.0 12/10/2024 1003   HDL 47.40 12/10/2024 1003   CHOLHDL 4 12/10/2024 1003   VLDL 25.6 12/10/2024 1003   LDLCALC 125 (H) 12/10/2024 1003   Hepatic Function Panel     Component Value Date/Time   PROT 6.9 12/10/2024 1003   ALBUMIN 4.3 12/10/2024 1003   AST 24 12/10/2024 1003   ALT 44 (H) 12/10/2024 1003   ALKPHOS 76 12/10/2024 1003   BILITOT 0.4 12/10/2024 1003   BILIDIR 0.1 12/10/2024 1003      Component Value Date/Time   TSH 0.91 12/10/2024 1003    Medications  Outpatient Encounter Medications as of 12/17/2024  Medication Sig   amphetamine -dextroamphetamine  (ADDERALL XR) 15 MG 24 hr capsule Take 1 capsule by mouth every morning.   amphetamine -dextroamphetamine  (ADDERALL XR) 15 MG 24 hr capsule Take 1 capsule by mouth every morning.   amphetamine -dextroamphetamine  (ADDERALL XR) 15 MG 24 hr capsule Take 1 capsule by mouth every morning.   amphetamine -dextroamphetamine  (ADDERALL) 10 MG tablet Take 1 tab after lunch daily as needed.   Multiple Vitamin (MULTIVITAMIN  WITH MINERALS) TABS tablet Take 1 tablet by mouth daily.   rizatriptan  (MAXALT ) 10 MG tablet Take 1 tablet (10 mg total) by mouth as needed for migraine. May repeat in 2 hours if needed   sertraline  (ZOLOFT ) 50 MG tablet Take 1 tablet (50 mg total) by mouth daily.   traZODone  (DESYREL ) 50 MG tablet Take 0.5-1 tablets (25-50 mg total) by mouth at bedtime as needed for sleep.   No facility-administered encounter medications on file as of 12/17/2024.     Assessment and Plan   Elevated LFTs Limit saturated fats and simple carbohydrates Loss of 10-15% of body weight can help improve hepatic steatosis  PCP is ordering liver U/S to further evaluate  Hyperlipidemia, unspecified hyperlipidemia type Focus on limiting saturated fats Loss of 10-15% body weight can improve  lipid levels Continue to follow regularly with PCP  Rheumatoid arthritis of multiple sites with negative rheumatoid factor (HCC)       Currently off medication-some increase in joint pain       Continue to follow routinely with Dr Jeannetta, Rheumatology  Class 1 obesity with serious comorbidity and body mass index (BMI) of 30.0 to 30.9 in adult, unspecified obesity type Obesity Treatment and Action Plan:  Patient will work on garnering support from family and friends to begin weight loss journey. Will work on eliminating or reducing the presence of highly palatable, calorie dense foods in the home. Will complete provided nutritional and psychosocial assessment questionnaire before the next appointment. Will be scheduled for indirect calorimetry to determine resting energy expenditure in a fasting state.  This will allow us  to create a reduced calorie, high-protein meal plan to promote loss of fat mass while preserving muscle mass. Counseled on the health benefits of losing 5%-15% of total body weight. Was counseled on nutritional approaches to weight loss and benefits of reducing processed foods and consuming plant-based foods and  high quality protein as part of nutritional weight management. Was counseled on pharmacotherapy and role as an adjunct in weight management.   Education and Additional resources  She was weighed on the bioimpedance scale and results were discussed and documented in the synopsis.  We discussed obesity as a progressive, chronic disease and the importance of a more detailed evaluation of all the factors contributing to the disease.  We reviewed the basic principles in obesity management.   We discussed the importance of long term lifestyle changes which include nutrition, exercise and behavioral modification as well as the importance of customizing this to her specific health and social needs.  We reviewed the role of medical interventions including pharmacotherapy and surgical interventions.   We discussed the benefits of reaching a healthier weight to alleviate the symptoms of existing conditions and reduce the risks of the biomechanical, cardiometabolic and psychological effects of obesity.  We reviewed our program approach and philosophy, which are guided by the four pillars of obesity medicine.  We discussed how to prepare for intake appointment and the importance of fasting and avoidance of stimulants for at least 8 hours prior to indirect calorimetry.  Colleen [name] appears to be in the action stage of change and reports being ready to initiate intensive lifestyle and behavioral modifications as part of their weight loss journey.  Attestation  Reviewed by clinician on day of visit: allergies, medications, problem list, medical history, surgical history, family history, social history, and previous encounter notes pertinent to obesity diagnosis.  I personally spent a total of 32 minutes in the care of the patient today including preparing to see the patient, getting/reviewing separately obtained history, performing a medically appropriate exam/evaluation, counseling and  educating, and documenting clinical information in the EHR.   Lonell Liverpool ANP-C "

## 2024-12-19 ENCOUNTER — Ambulatory Visit (HOSPITAL_BASED_OUTPATIENT_CLINIC_OR_DEPARTMENT_OTHER)
Admission: RE | Admit: 2024-12-19 | Discharge: 2024-12-19 | Disposition: A | Discharge status: Home / Self Care | Source: Ambulatory Visit | Attending: Family Medicine | Admitting: Family Medicine

## 2024-12-19 ENCOUNTER — Ambulatory Visit: Payer: Self-pay | Admitting: Family Medicine

## 2024-12-19 DIAGNOSIS — R7401 Elevation of levels of liver transaminase levels: Secondary | ICD-10-CM | POA: Diagnosis present

## 2025-01-07 ENCOUNTER — Ambulatory Visit (INDEPENDENT_AMBULATORY_CARE_PROVIDER_SITE_OTHER): Admitting: Nurse Practitioner

## 2025-01-11 ENCOUNTER — Other Ambulatory Visit

## 2025-01-23 ENCOUNTER — Ambulatory Visit (INDEPENDENT_AMBULATORY_CARE_PROVIDER_SITE_OTHER): Admitting: Nurse Practitioner

## 2025-05-06 ENCOUNTER — Ambulatory Visit: Admitting: Internal Medicine

## 2025-05-08 ENCOUNTER — Ambulatory Visit: Admitting: Family Medicine

## 2025-05-10 ENCOUNTER — Ambulatory Visit: Admitting: Family Medicine
